# Patient Record
Sex: Male | Born: 1985 | Race: White | Hispanic: Yes | Smoking: Current every day smoker
Health system: Southern US, Community
[De-identification: ages and names within clinical notes are randomized; demographics above are authoritative.]

## PROBLEM LIST (undated history)

## (undated) DIAGNOSIS — Z789 Other specified health status: Secondary | ICD-10-CM

## (undated) HISTORY — PX: WISDOM TOOTH EXTRACTION: SHX21

## (undated) HISTORY — PX: APPENDECTOMY: SHX54

---
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
## 2006-11-19 ENCOUNTER — Emergency Department (HOSPITAL_COMMUNITY): Admission: EM | Admit: 2006-11-19 | Discharge: 2006-11-19 | Payer: Self-pay | Admitting: Emergency Medicine

## 2007-06-02 ENCOUNTER — Emergency Department (HOSPITAL_COMMUNITY): Admission: EM | Admit: 2007-06-02 | Discharge: 2007-06-02 | Payer: Self-pay | Admitting: Emergency Medicine

## 2007-06-09 ENCOUNTER — Emergency Department (HOSPITAL_COMMUNITY): Admission: EM | Admit: 2007-06-09 | Discharge: 2007-06-09 | Payer: Self-pay | Admitting: Emergency Medicine

## 2014-07-23 ENCOUNTER — Encounter (HOSPITAL_COMMUNITY): Payer: Self-pay | Admitting: Emergency Medicine

## 2014-07-23 ENCOUNTER — Emergency Department (INDEPENDENT_AMBULATORY_CARE_PROVIDER_SITE_OTHER)
Admission: EM | Admit: 2014-07-23 | Discharge: 2014-07-23 | Disposition: A | Discharge status: Home / Self Care | Payer: Self-pay | Source: Home / Self Care | Attending: Family Medicine | Admitting: Family Medicine

## 2014-07-23 ENCOUNTER — Emergency Department (INDEPENDENT_AMBULATORY_CARE_PROVIDER_SITE_OTHER): Payer: Self-pay

## 2014-07-23 DIAGNOSIS — M79673 Pain in unspecified foot: Secondary | ICD-10-CM

## 2014-07-23 DIAGNOSIS — S93401A Sprain of unspecified ligament of right ankle, initial encounter: Secondary | ICD-10-CM

## 2014-07-23 MED ORDER — IBUPROFEN 800 MG PO TABS: 800.0000 mg | ORAL_TABLET | Freq: Three times a day (TID) | ORAL | Status: DC

## 2014-07-23 NOTE — ED Provider Notes (Signed)
CSN: [id number]     Arrival date & time 07/23/14  1954 History   First MD Initiated Contact with Patient 07/23/14 2031     Chief Complaint  Patient presents with  . Foot Pain   (Consider location/radiation/quality/duration/timing/severity/associated sxs/prior Treatment) HPI         28 year old male presents for evaluation of a right foot injury. He fell approximately 6 feet off a ladder at work 3 days ago. Since then he has had pain across the midfoot, slight swelling. Pain is increased with weightbearing. No medications taken for treatment. No numbness distal to the injury. No history of foot injuries. He is able to bear weight but with difficulty  History reviewed. No pertinent past medical history. History reviewed. No pertinent past surgical history. No family history on file. History  Substance Use Topics  . Smoking status: Never Smoker   . Smokeless tobacco: Not on file  . Alcohol Use: No    Review of Systems  Musculoskeletal: Positive for arthralgias (right foot pain and swelling).  All other systems reviewed and are negative.   Allergies  Review of patient's allergies indicates no known allergies.  Home Medications   Prior to Admission medications   Medication Sig Start Date End Date Taking? Authorizing Provider  ibuprofen (ADVIL,MOTRIN) 800 MG tablet Take 1 tablet (800 mg total) by mouth 3 (three) times daily. 07/23/14   Adrian BlackwaterZachary H Mica Ramdass, PA-C   BP 157/73  Pulse 60  Temp(Src) 98.6 F (37 C) (Oral)  Resp 14  SpO2 98% Physical Exam  Nursing note and vitals reviewed. Constitutional: He is oriented to person, place, and time. He appears well-developed and well-nourished. No distress.  HENT:  Head: Normocephalic.  Pulmonary/Chest: Effort normal. No respiratory distress.  Musculoskeletal:       Right ankle: Normal.       Right foot: He exhibits tenderness (Tenderness at ATFL). He exhibits normal range of motion, no swelling and no deformity.  Neurological: He is  alert and oriented to person, place, and time. Coordination normal.  Skin: Skin is warm and dry. No rash noted. He is not diaphoretic.  Psychiatric: He has a normal mood and affect. Judgment normal.    ED Course  Procedures (including critical care time) Labs Review Labs Reviewed - No data to display  Imaging Review Dg Foot Complete Right  07/23/2014   CLINICAL DATA:  Foot pain.  Recent fall.  Initial encounter  EXAM: RIGHT FOOT COMPLETE - 3+ VIEW  COMPARISON:  None.  FINDINGS: There is no evidence of fracture or dislocation. There is no evidence of arthropathy or other focal bone abnormality. Soft tissues are unremarkable.  IMPRESSION: Negative.   Electronically Signed   By: Tiburcio PeaJonathan  Watts M.D.   On: 07/23/2014 20:26     MDM   1. Ankle sprain, right, initial encounter   2. Foot pain    Ankle sprain. Treat with Aircast, ibuprofen. Follow-up when necessary   Meds ordered this encounter  Medications  . ibuprofen (ADVIL,MOTRIN) 800 MG tablet    Sig: Take 1 tablet (800 mg total) by mouth 3 (three) times daily.    Dispense:  60 tablet    Refill:  0    Order Specific Question:  Supervising Provider    Answer:  Clementeen GrahamOREY, EVAN, Kathie RhodesS [[id number]]     Graylon GoodZachary H Ouita Nish, PA-C 07/24/14 [phone]

## 2014-07-23 NOTE — Discharge Instructions (Signed)
Esguince de tobillo (Ankle Sprain)  Un esguince de tobillo es una lesin en los tejidos fuertes y fibrosos (ligamentos) que mantienen unidos los huesos de la articulacin del tobillo.  CAUSAS  Las causas pueden ser una cada o la torcedura del tobillo. Los esguinces de tobillo ocurren con ms frecuencia al pisar con el borde exterior del pie, lo que hace que el tobillo se vuelva hacia adentro. Las personas que practican deportes son ms propensas a este tipo de lesiones.  SNTOMAS   Dolor en el tobillo. El dolor puede aparecer durante el reposo o slo al tratar de ponerse de pie o caminar.  Hinchazn.  Hematomas. Los hematomas pueden aparecer inmediatamente o luego de 1 a 2 das despus de la lesin.  Dificultad para pararse o caminar, especialmente al doblar en esquinas o al cambiar de direccin. DIAGNSTICO  El mdico le preguntar detalles acerca de la lesin y le har un examen fsico del tobillo para determinar si tiene un esguince. Durante el examen fsico, el mdico apretar y aplicar presin en reas especficas del pie y del tobillo. El mdico tratar de mover el tobillo en ciertas direcciones. Le indicarn una radiografa para descartar la fractura de un hueso o que un ligamento no se haya separado de uno de los huesos del tobillo (fractura por avulsin).  TRATAMIENTO  Algunos tipos de soporte podrn ayudarlo a estabilizar el tobillo. El profesional que lo asiste le dar las indicaciones. Tambin podr indicarle que use medicamentos para calmar el dolor. Si el esguince es grave, su mdico podr derivarlo a un cirujano que lo ayudar a recuperar la funcin de las partes afectadas del sistema esqueltico (ortopedista) o a un fisioterapeuta.  INSTRUCCIONES PARA EL CUIDADO EN EL HOGAR   Aplique hielo en la articulacin lesionada durante 1  2 das o segn lo que le indique su mdico. La aplicacin del hielo ayuda a reducir la inflamacin y el dolor.  Ponga el hielo en una bolsa  plstica.  Colquese una toalla entre la piel y la bolsa de hielo.  Deje el hielo en el lugar durante 15 a 20 minutos por vez, cada 2 horas mientras est despierto.  Slo tome medicamentos de venta libre o recetados para calmar el dolor, las molestias o bajar la fiebre segn las indicaciones de su mdico.  Eleve el tobillo lesionado por encima del nivel del corazn tanto como pueda durante 2 o 3 das.  Si su mdico le indica el uso de muletas, selas segn las instrucciones. Gradualmente lleve el peso sobre el tobillo afectado. Siga usando muletas o un bastn hasta que pueda caminar sin sentir dolor en el tobillo.  Si tiene una frula de yeso, sela como lo indique su mdico. No se apoye en ninguna cosa ms dura que una almohada durante las primeras 24 horas. No ponga peso sobre la frula. No permita que se moje. Puede quitrsela para tomar una ducha o un bao.  Pueden haberle colocado un vendaje elstico para usar alrededor del tobillo para darle soporte. Si el vendaje elstico est muy ajustado (siente adormecimiento u hormigueo o el pie est fro y azul), ajstelo para que sea ms cmodo.  Si usted tiene una frula de aire, puede soplar o dejar salir el aire para que sea ms cmodo. Puede quitarse la frula por la noche y antes de tomar una ducha o un bao. Mueva los dedos de los pies en la frula varias veces al da para disminuir la hinchazn. SOLICITE ATENCIN MDICA SI:     Le aumenta rpidamente el moretn o el hinchazn.  Los dedos de los pies estn extremadamente fros o pierde la sensibilidad en el pie.  El dolor no se alivia con los medicamentos. SOLICITE ATENCIN MDICA DE INMEDIATO SI:   Los dedos de los pies estn adormecidos o de color azul.  Tiene un dolor agudo que va aumentando. ASEGRESE DE QUE:   Comprende estas instrucciones.  Controlar su enfermedad.  Solicitar ayuda de inmediato si no mejora o empeora. Document Released: 09/11/2005 Document Revised:  06/05/2012 ExitCare Patient Information 2015 [employer]. This information is not intended to replace advice given to you by your health care provider. Make sure you discuss any questions you have with your health care provider.  

## 2014-07-23 NOTE — ED Notes (Signed)
Fell approx 6-7 feet, landed on right foot.  Points to pain being under right ankle, including heel of foot.  Pedal pulses 2 plus.

## 2015-09-24 ENCOUNTER — Emergency Department (INDEPENDENT_AMBULATORY_CARE_PROVIDER_SITE_OTHER)
Admission: EM | Admit: 2015-09-24 | Discharge: 2015-09-24 | Disposition: A | Discharge status: Home / Self Care | Payer: Self-pay | Source: Home / Self Care | Attending: Family Medicine | Admitting: Family Medicine

## 2015-09-24 ENCOUNTER — Encounter (HOSPITAL_COMMUNITY): Payer: Self-pay | Admitting: Emergency Medicine

## 2015-09-24 DIAGNOSIS — M791 Myalgia, unspecified site: Secondary | ICD-10-CM

## 2015-09-24 DIAGNOSIS — J029 Acute pharyngitis, unspecified: Secondary | ICD-10-CM

## 2015-09-24 DIAGNOSIS — R509 Fever, unspecified: Secondary | ICD-10-CM

## 2015-09-24 LAB — POCT RAPID STREP A: Streptococcus, Group A Screen (Direct): NEGATIVE

## 2015-09-24 MED ORDER — KETOROLAC TROMETHAMINE 60 MG/2ML IM SOLN
INTRAMUSCULAR | Status: AC
  Filled 2015-09-24: qty 2

## 2015-09-24 MED ORDER — PENICILLIN G BENZATHINE 1200000 UNIT/2ML IM SUSP
1.2000 10*6.[IU] | Freq: Once | INTRAMUSCULAR | Status: AC
  Administered 2015-09-24: 1.2 10*6.[IU] via INTRAMUSCULAR

## 2015-09-24 MED ORDER — IBUPROFEN 600 MG PO TABS: 600.0000 mg | ORAL_TABLET | Freq: Four times a day (QID) | ORAL | Status: DC | PRN

## 2015-09-24 MED ORDER — KETOROLAC TROMETHAMINE 60 MG/2ML IM SOLN
60.0000 mg | Freq: Once | INTRAMUSCULAR | Status: AC
  Administered 2015-09-24: 60 mg via INTRAMUSCULAR

## 2015-09-24 MED ORDER — AMOXICILLIN 500 MG PO CAPS: 500.0000 mg | ORAL_CAPSULE | Freq: Three times a day (TID) | ORAL | Status: DC

## 2015-09-24 MED ORDER — ACETAMINOPHEN 325 MG PO TABS
ORAL_TABLET | ORAL | Status: AC
  Filled 2015-09-24: qty 2

## 2015-09-24 MED ORDER — PENICILLIN G BENZATHINE 1200000 UNIT/2ML IM SUSP
INTRAMUSCULAR | Status: AC
  Filled 2015-09-24: qty 2

## 2015-09-24 MED ORDER — ACETAMINOPHEN 325 MG PO TABS
650.0000 mg | ORAL_TABLET | Freq: Once | ORAL | Status: AC
  Administered 2015-09-24: 650 mg via ORAL

## 2015-09-24 NOTE — ED Notes (Signed)
Reports sore throat, fever, and generalized body aches.

## 2015-09-24 NOTE — ED Provider Notes (Signed)
CSN: [id number]     Arrival date & time 09/24/15  1303 History   First MD Initiated Contact with Patient 09/24/15 1328     Chief Complaint  Patient presents with  . Sore Throat  . Fever   (Consider location/radiation/quality/duration/timing/severity/associated sxs/prior Treatment) HPI Comments: 29 year old sweating male is accompanied by his wife who speaks [place]. He has had a "very bad" sore throat for 3 days. It is progressing. It is associated with a subjective fever and bad sore throat, body aches, minor headache and "a little bit of vomiting". Denies abdominal pain. When his fever goes up he develops myalgias. He has taken Tylenol to help decrease the fever and that does help him to feel better temporarily. His current temp is 102.7.  Patient is a 29 y.o. male presenting with pharyngitis and fever. The history is provided by the patient and the spouse. A language interpreter was used.  Sore Throat This is a new problem. The current episode started more than 2 days ago. The problem occurs constantly. The problem has been gradually worsening. Associated symptoms include headaches. Pertinent negatives include no chest pain, no abdominal pain and no shortness of breath. The symptoms are aggravated by swallowing and eating. Nothing relieves the symptoms. He has tried acetaminophen for the symptoms. The treatment provided mild relief.  Fever Associated symptoms: headaches, myalgias and sore throat   Associated symptoms: no chest pain, no congestion, no cough, no ear pain and no rash     History reviewed. No pertinent past medical history. History reviewed. No pertinent past surgical history. No family history on file. Social History  Substance Use Topics  . Smoking status: Current Every Day Smoker  . Smokeless tobacco: None  . Alcohol Use: Yes    Review of Systems  Constitutional: Positive for fever, activity change and fatigue.  HENT: Positive for sore throat. Negative for  congestion, ear pain, postnasal drip and voice change.        Odynophagia  Eyes: Negative.   Respiratory: Negative for cough and shortness of breath.   Cardiovascular: Negative for chest pain.  Gastrointestinal: Negative for abdominal pain.  Genitourinary: Negative.   Musculoskeletal: Positive for myalgias.  Skin: Negative for rash.  Neurological: Positive for weakness and headaches. Negative for tremors, syncope and speech difficulty.  Psychiatric/Behavioral: Negative.   All other systems reviewed and are negative.   Allergies  Review of patient's allergies indicates no known allergies.  Home Medications   Prior to Admission medications   Medication Sig Start Date End Date Taking? Authorizing Provider  acetaminophen (TYLENOL) 325 MG tablet Take 650 mg by mouth every 6 (six) hours as needed.   Yes Historical Provider, MD  amoxicillin (AMOXIL) 500 MG capsule Take 1 capsule (500 mg total) by mouth 3 (three) times daily. 09/24/15   Hayden Rasmussen, NP  ibuprofen (ADVIL,MOTRIN) 600 MG tablet Take 1 tablet (600 mg total) by mouth every 6 (six) hours as needed. 09/24/15   Hayden Rasmussen, NP   Meds Ordered and Administered this Visit   Medications  penicillin g benzathine (BICILLIN LA) 1200000 UNIT/2ML injection 1.2 Million Units (not administered)  ketorolac (TORADOL) injection 60 mg (not administered)  acetaminophen (TYLENOL) tablet 650 mg (650 mg Oral Given 09/24/15 1401)    BP 129/84 mmHg  Pulse 98  Temp(Src) 102.7 F (39.3 C) (Oral)  Resp 16  SpO2 98% No data found.   Physical Exam  Constitutional: He is oriented to person, place, and time. He appears well-developed and well-nourished. No distress.  HENT:  Mouth/Throat: Oropharyngeal exudate present.  Bilateral TMs are normal Oropharynx with mildly swollen palatine tonsils. Beefy red, multiple exudates.  Eyes: EOM are normal.  Neck: Normal range of motion. Neck supple.  Cardiovascular: Normal rate, normal heart sounds and  intact distal pulses.   Pulmonary/Chest: Effort normal. No respiratory distress. He has no wheezes. He has no rales. He exhibits no tenderness.  Abdominal: Soft. There is no tenderness.  Musculoskeletal: He exhibits no edema.  Lymphadenopathy:    He has cervical adenopathy.  Neurological: He is alert and oriented to person, place, and time. He exhibits normal muscle tone.  Skin: Skin is warm and dry.  Psychiatric: He has a normal mood and affect.  Nursing note and vitals reviewed.   ED Course  Procedures (including critical care time)  Labs Review Labs Reviewed  POCT RAPID STREP A   Results for orders placed or performed during the hospital encounter of 09/24/15  POCT rapid strep A [facility])  Result Value Ref Range   Streptococcus, Group A Screen (Direct) NEGATIVE NEGATIVE     Imaging Review No results found.   Visual Acuity Review  Right Eye Distance:   Left Eye Distance:   Bilateral Distance:    Right Eye Near:   Left Eye Near:    Bilateral Near:         MDM   1. Exudative pharyngitis   2. Fever in adult   3. Myalgia    Suspect false neg on strep,  Toradol 60 mg IM Bicillin LA 1.2 million u IM Amoxil 500 tid  Cepacol lozenges Ibuprofen 600 mg as directed Increase liquid intake. Need more fluids     Hayden Rasmussenavid Elika Godar, NP 09/24/15 [phone]

## 2015-09-24 NOTE — Discharge Instructions (Signed)
[employer] (Fever, Adult) La fiebre es el aumento de la Arts development officer. A menudo se la define como una temperatura de 100F (38C) o ms. Por lo general, los episodios de fiebre leve o moderada que duran poco tiempo no tienen efectos a largo plazo y no requieren TEFL teacher. Los episodios de fiebre moderada o alta pueden ser [employer] y, a veces, un signo de una enfermedad grave. La sudoracin que se puede presentar cuando los episodios de fiebre son reiterados o prolongados tambin pueden derivar en deshidratacin. Para confirmar la presencia de fiebre, se toma la temperatura con un termmetro. La medicin de la temperatura puede variar en funcin de los siguientes factores:  La edad.  La hora del da.  El lugar donde se coloca el termmetro:  La boca (oral).  El recto (rectal).  El odo (timpnico).  Debajo del brazo Administrator, Civil Service).  La frente (temporal). INSTRUCCIONES PARA EL CUIDADO EN EL HOGAR Est atento a cualquier cambio en los sntomas. Tome estas medidas para controlar la afeccin:  Tome los medicamentos de venta libre y los recetados solamente como se lo haya indicado el mdico. Siga cuidadosamente las indicaciones en cuanto a la dosis.  Si le recetaron un antibitico, tmelo como se lo haya indicado el mdico. No deje de tomar los antibiticos aunque comience a Actor.  Descanse todo lo que sea necesario.  Beba suficiente lquido para Photographer orina clara o de color amarillo plido. Esto ayuda a Statistician.  Higiencese con Delma Freeze o dese un bao con agua a temperatura ambiente para ayudar a Teaching laboratory technician, si es necesario. No use agua helada.  No se tape con demasiadas mantas ni use ropa abrigada. SOLICITE ATENCIN MDICA SI:  Vomita.  No puede comer ni beber sin vomitar.  Tiene diarrea.  Siente dolor al [employer].  Los sntomas no mejoran con Scientist, research (medical).  Presenta nuevos sntomas.  Est muy  dbil. SOLICITE ATENCIN MDICA DE INMEDIATO SI:  Le falta el aire o tiene dificultad para respirar.  Est mareado o se desmaya.  Est desorientado o confundido.  Tiene signos de deshidratacin, como sequedad en la boca, disminucin de la cantidad de [place].  Siente dolor intenso en el abdomen.  Tiene nuseas o diarrea persistentes.  Tiene una erupcin cutnea.  Los sntomas empeoran repentinamente.   Esta informacin no tiene Theme park manager el consejo del mdico. Asegrese de hacerle al mdico cualquier pregunta que tenga.   Document Released: 06/21/2005 Document Revised: 06/02/2015 Elsevier Interactive Patient Education 2016 [employer].  Faringitis (Pharyngitis) La faringitis ocurre cuando la faringe presenta enrojecimiento, dolor e hinchazn (inflamacin).  CAUSAS  Normalmente, la faringitis se debe a una infeccin. Generalmente, estas infecciones ocurren debido a virus (viral) y se presentan cuando las personas se resfran. Sin embargo, a Advertising account executive faringitis es provocada por bacterias (bacteriana). Las alergias tambin pueden ser una causa de la faringitis. La faringitis viral se puede contagiar de Neomia Dear persona a otra al toser, estornudar y compartir objetos o utensilios personales (tazas, tenedores, cucharas, cepillos de diente). La faringitis bacteriana se puede contagiar de Neomia Dear persona a otra a travs de un contacto ms ntimo, como besar.  SIGNOS Y SNTOMAS  Los sntomas de la faringitis incluyen los siguientes:   Dolor de Advertising copywriter.  Cansancio (fatiga).  Fiebre no muy elevada.  Dolor de [place].  Dolores musculares y en las articulaciones.  Erupciones cutneas  Ganglios linfticos hinchados.  Una pelcula parecida a las placas en la  garganta o las amgdalas (frecuente con la faringitis bacteriana). DIAGNSTICO  El mdico le har preguntas sobre la enfermedad y sus sntomas. Normalmente, todo lo que se necesita para diagnosticar una faringitis son  sus antecedentes mdicos y un examen fsico. A veces se realiza una prueba rpida para estreptococos. Tambin es posible que se realicen otros anlisis de laboratorio, segn la posible causa.  TRATAMIENTO  La faringitis viral normalmente mejorar en un plazo de 3 a 4das sin medicamentos. La faringitis bacteriana se trata con medicamentos que McGraw-Hillmatan los grmenes (antibiticos).  INSTRUCCIONES PARA EL CUIDADO EN EL HOGAR   Beba gran cantidad de lquido para mantener la orina de tono claro o color amarillo plido.  Tome solo medicamentos de venta libre o recetados, segn las indicaciones del mdico.  Si le receta antibiticos, asegrese de terminarlos, incluso si comienza a Actorsentirse mejor.  No tome aspirina.  Descanse lo suficiente.  Hgase grgaras con 8onzas (227ml) de agua con sal (cucharadita de sal por litro de agua) cada 1 o 2horas para Science writercalmar la garganta.  Puede usar pastillas (si no corre riesgo de Health visitorahogarse) o aerosoles para Science writercalmar la garganta. SOLICITE ATENCIN MDICA SI:   Tiene bultos grandes y dolorosos en el cuello.  Tiene una erupcin cutnea.  Cuando tose elimina una expectoracin verde, amarillo amarronado o con Wilkinsonsangre. SOLICITE ATENCIN MDICA DE INMEDIATO SI:   El cuello se pone rgido.  Comienza a babear o no puede tragar lquidos.  Vomita o no puede retener los American International Groupmedicamentos ni los lquidos.  Siente un dolor intenso que no se alivia con los medicamentos recomendados.  Tiene dificultades para respirar (y no debido a la nariz tapada). ASEGRESE DE QUE:   Comprende estas instrucciones.  Controlar su afeccin.  Recibir ayuda de inmediato si no mejora o si empeora.   Esta informacin no tiene Theme park managercomo fin reemplazar el consejo del mdico. Asegrese de hacerle al mdico cualquier pregunta que tenga.   Document Released: 06/21/2005 Document Revised: 07/02/2013 Elsevier Interactive Patient Education 2016 [employer]  Dolor de garganta  (Sore  Throat) Cepacol lozenges Ibuprofen 600 mg as directed Increase liquid intake. Need more fluids  El dolor de garganta es Chief Technology Officerel dolor, ardor, irritacin o sensacin de Clinical research associatepicazn en la garganta. Generalmente hay dolor o molestias al tragar o hablar. Un dolor de garganta puede estar acompaado de otros sntomas, como tos, estornudos, fiebre y ganglios hinchados en el cuello. Generalmente es Financial risk analystel primer signo de otra enfermedad, como un resfrio, gripe, anginas o mononucleosis (conocida como mono). La mayor parte de los dolores de garganta desaparecen sin tratamiento mdico. CAUSAS  Las causas ms comunes de dolor de garganta son:   Infecciones virales, como un resfrio, gripe o mononucleosis.  Infeccin bacteriana, como faringitis estreptoccica, amigdalitis, o tos ferina.  Alergias estacionales.  La sequedad en el aire.  Algunos irritantes, como el humo o la polucin.  Reflujo gastroesofgico. INSTRUCCIONES PARA EL CUIDADO EN EL HOGAR   Tome slo la medicacin que le indic el mdico.  Debe ingerir gran cantidad de lquido para mantener la orina de tono claro o color amarillo plido.  Descanse todo lo que sea necesario.  Trate de usar Unisys Corporationaerosoles para la garganta, pastillas o chupe caramelos duros para Engineer, materialsaliviar el dolor (si es mayor de 4 aos o segn lo que le indiquen).  Beba lquidos calientes, como caldos, infusiones de hierbas o agua caliente con miel para calmar el dolor momentneamente. Tambin puede comer o beber lquidos fros o congelados tales como paletas de hielo congelado.  Haga grgaras con agua con sal (mezclar 1 cucharadita de sal en 8 onzas [250 cm3] de agua).  No fume, y evite el humo de otros fumadores.  Ponga un humidificador de vapor fro en la habitacin por la noche para humedecer el aire. Tambin se puede activar en una ducha de agua caliente y sentarse en el bao con la puerta cerrada durante 5-10 minutos. SOLICITE ATENCIN MDICA DE INMEDIATO SI:   Tiene dificultad  para respirar.  No puede tragar lquidos, alimentos blandos, o su saliva.  Usted tiene ms inflamacin en la garganta.  El dolor de garganta no mejora en [address].  Tiene nuseas o vmitos.  Tiene fiebre o sntomas que persisten durante ms de 2 o 3 das.  Tiene fiebre y los sntomas empeoran de manera sbita. ASEGRESE DE QUE:   Comprende estas instrucciones.  Controlar su enfermedad.  Solicitar ayuda de inmediato si no mejora o si empeora.   Esta informacin no tiene Theme park manager el consejo del mdico. Asegrese de hacerle al mdico cualquier pregunta que tenga.   Document Released: 09/11/2005 Document Revised: 08/28/2012 Elsevier Interactive Patient Education [employer]

## 2015-09-27 LAB — CULTURE, GROUP A STREP

## 2016-07-09 ENCOUNTER — Emergency Department (HOSPITAL_COMMUNITY): Payer: Self-pay

## 2016-07-09 ENCOUNTER — Emergency Department (HOSPITAL_COMMUNITY)
Admission: EM | Admit: 2016-07-09 | Discharge: 2016-07-10 | Disposition: A | Discharge status: Home / Self Care | Payer: Self-pay | Attending: Emergency Medicine | Admitting: Emergency Medicine

## 2016-07-09 DIAGNOSIS — F17200 Nicotine dependence, unspecified, uncomplicated: Secondary | ICD-10-CM | POA: Insufficient documentation

## 2016-07-09 DIAGNOSIS — M79644 Pain in right finger(s): Secondary | ICD-10-CM

## 2016-07-09 NOTE — ED Triage Notes (Signed)
Patient arrives via [facility] with complaint of proximal right thumb pain. Pain is chronic for patient. [facility] reports that injury did not occur from their acquisition of patient. Right thumb appears mildly deformed at base.

## 2016-07-09 NOTE — ED Provider Notes (Signed)
[facility] Provider Note  By signing my name below, I, Earmon PhoenixJennifer Waddell, attest that this documentation has been prepared under the direction and in the presence of Will Hanifah Royse, PA-C. Electronically Signed: Earmon PhoenixJennifer Waddell, ED Scribe. 07/09/16. 11:37 PM.   History   Chief Complaint Chief Complaint  Patient presents with  . Hand Pain   The history is provided by the patient and medical records. A language interpreter was used.    HPI Comments:  Edward Gardner is a 30 y.o. male brought in by [employer] who presents to the Emergency Department complaining of right thumb pain that began yesterday while he was working. He reports this happened before he was in [employer] custody. He is not forthcoming on further details of how this injury occurred. Pt denies any other injuries. He denies numbness, tingling or weakness.    No past medical history on file.  There are no active problems to display for this patient.   No past surgical history on file.     Home Medications    Prior to Admission medications   Medication Sig Start Date End Date Taking? Authorizing Provider  acetaminophen (TYLENOL) 325 MG tablet Take 2 tablets (650 mg total) by mouth every 6 (six) hours as needed for mild pain or moderate pain. 07/10/16   Everlene FarrierWilliam Baily Serpe, PA-C  amoxicillin (AMOXIL) 500 MG capsule Take 1 capsule (500 mg total) by mouth 3 (three) times daily. 09/24/15   Hayden Rasmussenavid Mabe, NP  ibuprofen (ADVIL,MOTRIN) 600 MG tablet Take 1 tablet (600 mg total) by mouth every 6 (six) hours as needed. 09/24/15   Hayden Rasmussenavid Mabe, NP    Family History No family history on file.  Social History Social History  Substance Use Topics  . Smoking status: Current Every Day Smoker  . Smokeless tobacco: Not on file  . Alcohol use Yes     Allergies   Review of patient's allergies indicates no known allergies.   Review of Systems Review of Systems  Musculoskeletal: Positive for arthralgias. Negative for joint swelling.    Skin: Negative for color change and wound.  Neurological: Negative for weakness and numbness.     Physical Exam Updated Vital Signs BP 152/91   Pulse 98   Temp 98 F (36.7 C) (Oral)   Resp 16   SpO2 100%   Physical Exam  Constitutional: He appears well-developed and well-nourished. No distress.  HENT:  Head: Normocephalic and atraumatic.  Eyes: Right eye exhibits no discharge. Left eye exhibits no discharge.  Cardiovascular: Normal rate, regular rhythm and intact distal pulses.   Good capillary refill of right hand. Good bilateral radial pulses  Pulmonary/Chest: Effort normal. No respiratory distress.  Musculoskeletal: Normal range of motion. He exhibits tenderness. He exhibits no edema or deformity.  TTP to the base of his right thumb. No edema or ecchymosis of right thumb or right hand. No deformity.  Neurological: He is alert. Coordination normal.  Distal sensations of fingers of right hand intact.  Skin: Skin is warm and dry. No rash noted. He is not diaphoretic. No erythema. No pallor.  Psychiatric: He has a normal mood and affect.  Patient is irritable.   Nursing note and vitals reviewed.    ED Treatments / Results  DIAGNOSTIC STUDIES: Oxygen Saturation is 100% on RA, normal by my interpretation.   COORDINATION OF CARE: 11:36 PM- Will X-Ray right thumb. Pt verbalizes understanding and agrees to plan.  Medications - No data to display  Labs (all labs ordered are listed, but only  abnormal results are displayed) Labs Reviewed - No data to display  EKG  EKG Interpretation None       Radiology Dg Hand Complete Right  Result Date: 07/10/2016 CLINICAL DATA:  30 y/o  M; injury with first metacarpal pain. EXAM: RIGHT HAND - COMPLETE 3+ VIEW COMPARISON:  None. FINDINGS: There is no evidence of fracture or dislocation. There is no evidence of arthropathy or other focal bone abnormality. IMPRESSION: No acute fracture or dislocation identified. Electronically  Signed   By: Mitzi Hansen M.D.   On: 07/10/2016 01:08    Procedures Procedures (including critical care time)  Medications Ordered in ED Medications - No data to display   Initial Impression / Assessment and Plan / ED Course  I have reviewed the triage vital signs and the nursing notes.  Pertinent labs & imaging results that were available during my care of the patient were reviewed by me and considered in my medical decision making (see chart for details).  Clinical Course   This is a 30 y.o. male brought in by [employer] who presents to the Emergency Department complaining of right thumb pain that began yesterday while he was working. He reports this happened before he was in [employer] custody. He is not forthcoming on further details of how this injury occurred. On exam the patient is afebrile nontoxic appearing. His tenderness to the base of his thumb on his right hand. No deformity noted. No ecchymosis, edema or warmth. He is neurovascular intact. X-rays unremarkable. Will place the patient a thumb spica splint and have him follow-up with hand surgery as patient is not very forthcoming on history and exam. I encouraged him to follow-up with hand surgery when he is released from jail. I encouraged him to use Tylenol and ice for treatment of his pain. I discussed return precautions. The patient verbalized understanding and agreement with plan. He is released to [employer].   Final Clinical Impressions(s) / ED Diagnoses   Final diagnoses:  Pain of right thumb    New Prescriptions Current Discharge Medication List     I personally performed the services described in this documentation, which was scribed in my presence. The recorded information has been reviewed and is accurate.        Everlene Farrier, PA-C 07/10/16 0132    Gilda Crease, MD 07/11/16 [phone]

## 2016-07-10 MED ORDER — ACETAMINOPHEN 325 MG PO TABS: 650.0000 mg | ORAL_TABLET | Freq: Four times a day (QID) | ORAL | 0 refills | Status: DC | PRN

## 2017-03-05 ENCOUNTER — Other Ambulatory Visit: Payer: Self-pay | Admitting: Occupational Medicine

## 2017-03-05 ENCOUNTER — Ambulatory Visit: Payer: Self-pay

## 2017-03-05 DIAGNOSIS — M79632 Pain in left forearm: Secondary | ICD-10-CM

## 2018-01-28 IMAGING — CR DG FOREARM 2V*L*
2 series · 2 of 2 positions shown · non-contrast
Comparison: None.

CLINICAL DATA: Recent puncture from nail

EXAM:
LEFT FOREARM - 2 VIEW

[view not recorded (1 of 2)]
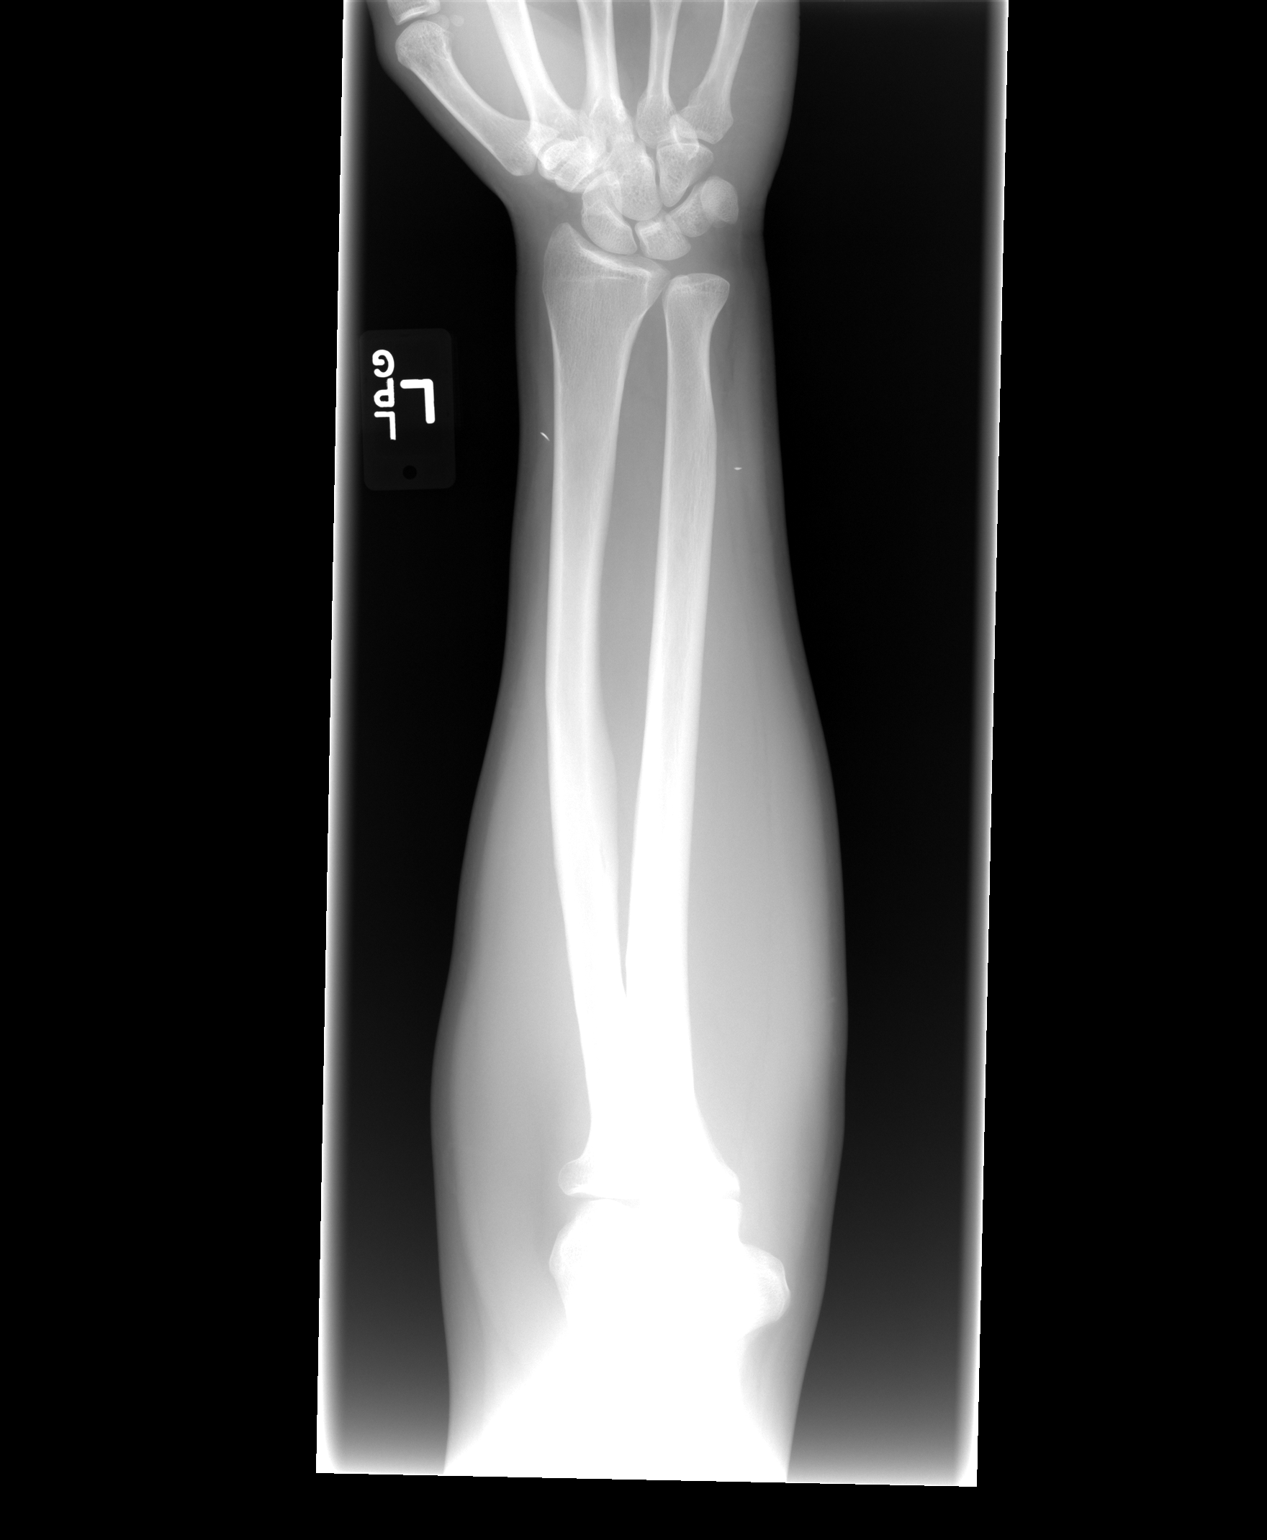

[view not recorded (2 of 2)]
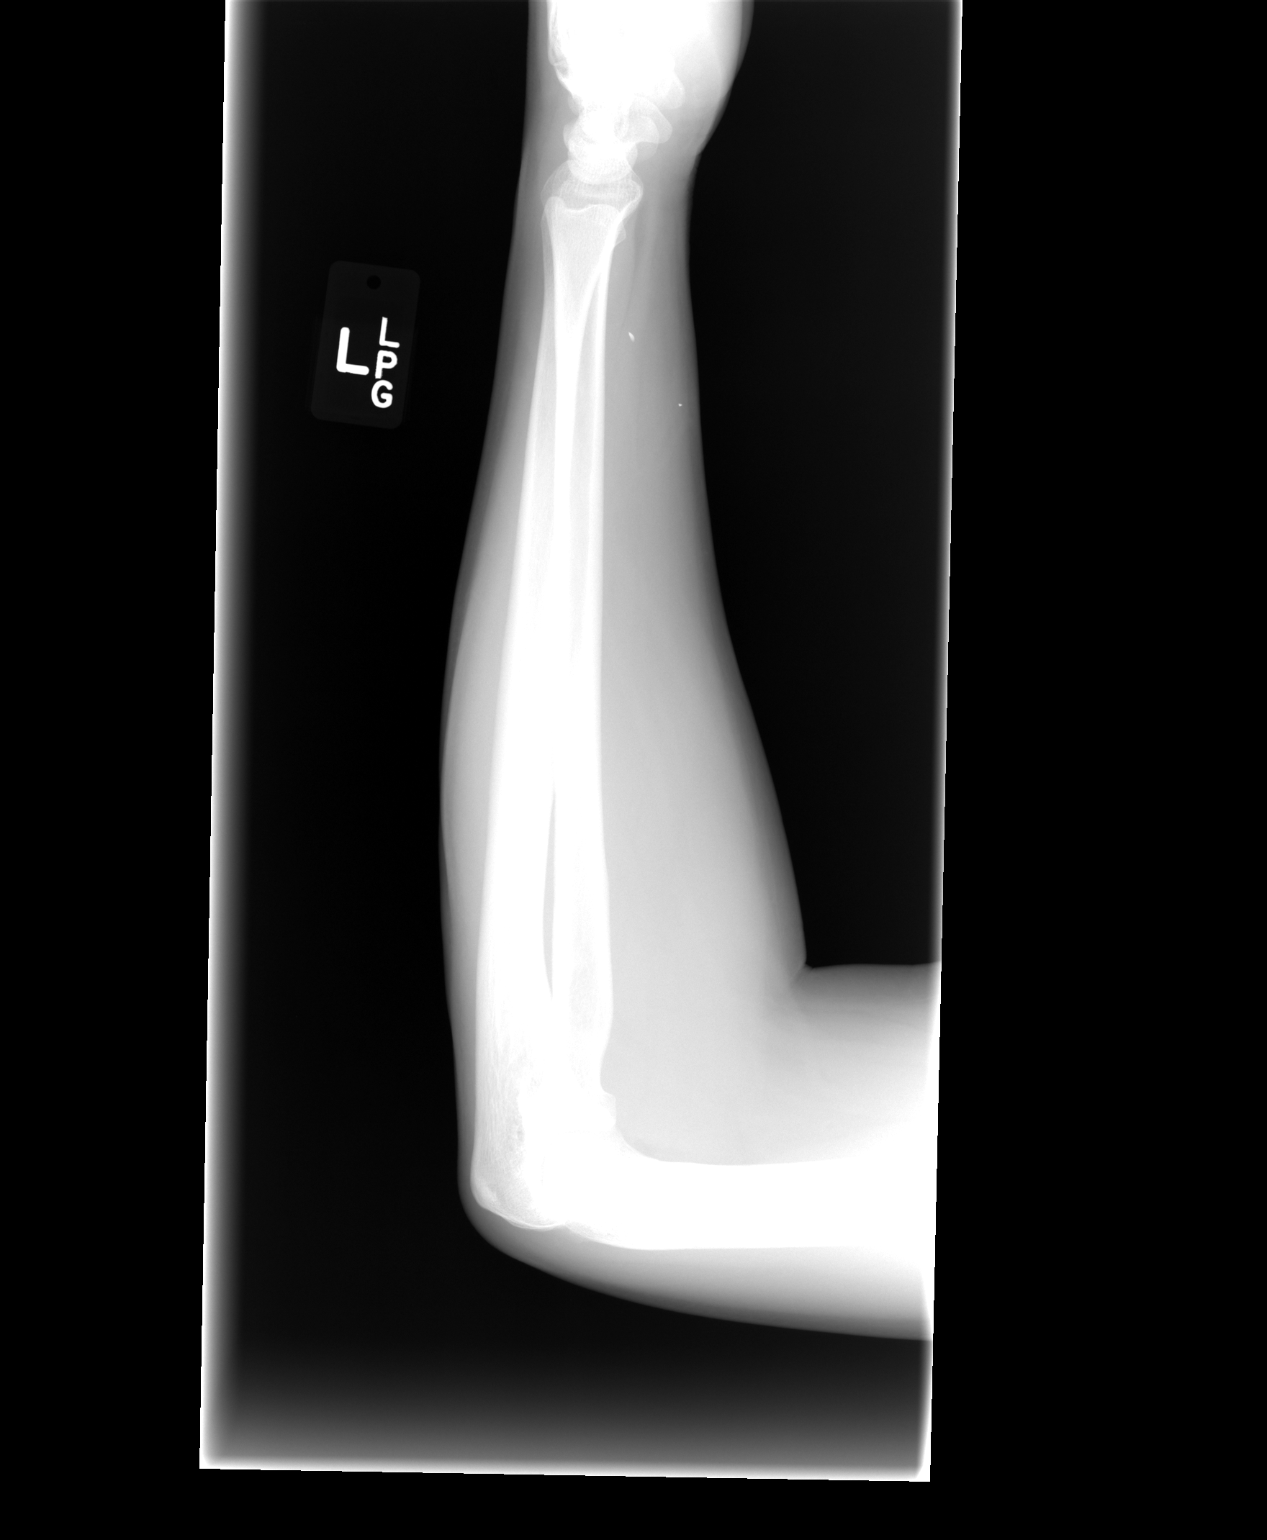

[2 of 2 positions shown; findings below may reference images not displayed]

FINDINGS: Frontal and lateral views were obtained. There is a linear metallic
foreign body measuring 3 mm in length located lateral and volar to
the distal radial diaphysis. There is a radiopaque foreign body
measuring just over 1 mm in length volar and medial to the distal
ulnar diaphysis. No other radiopaque foreign bodies identified. No
fracture or dislocation. Joint spaces appear normal. No abnormal
periosteal reaction. No bony destruction.
IMPRESSION: There are 2 small radiopaque foreign bodies as noted above. No bony
abnormality. Joint spaces appear normal.

## 2018-05-21 ENCOUNTER — Ambulatory Visit (INDEPENDENT_AMBULATORY_CARE_PROVIDER_SITE_OTHER): Payer: Self-pay | Admitting: Physician Assistant

## 2018-05-21 ENCOUNTER — Encounter (INDEPENDENT_AMBULATORY_CARE_PROVIDER_SITE_OTHER): Payer: Self-pay | Admitting: Physician Assistant

## 2018-05-21 VITALS — BP 118/71 | HR 65 | Temp 98.1°F | Resp 14 | Ht 67.75 in | Wt 192.6 lb

## 2018-05-21 DIAGNOSIS — Z131 Encounter for screening for diabetes mellitus: Secondary | ICD-10-CM

## 2018-05-21 DIAGNOSIS — Z23 Encounter for immunization: Secondary | ICD-10-CM

## 2018-05-21 DIAGNOSIS — R1013 Epigastric pain: Secondary | ICD-10-CM

## 2018-05-21 DIAGNOSIS — K219 Gastro-esophageal reflux disease without esophagitis: Secondary | ICD-10-CM

## 2018-05-21 MED ORDER — RANITIDINE HCL 150 MG PO TABS: 150.0000 mg | ORAL_TABLET | Freq: Two times a day (BID) | ORAL | 0 refills | Status: DC

## 2018-05-21 NOTE — Progress Notes (Signed)
Subjective:  Patient ID: Edward Gardner, male    DOB: 04/09/1986  Age: 32 y.o. MRN: [id number]  CC: abdominal pain  HPI Edward Gardner is a 32 y.o. male with a medical history of appendectomy presents as a new patient with mild epigastric abdominal pain since one month ago. However he has been well for a week. Says he also has occasional acid reflux with acidic foods and spicy foods. Pain was 3-4/10. Has not taken anything for relief. Does not endorse melena, BRBPR, or hematemesis. Says he has a family hx of pancreatic cancer and has become concerned if his symptoms are related.     Has also been told he should have his sugar checked after visiting his ophthalmologist five days ago. Pt has also been told his BP was elevated. Has family hx of diabetes and hypertension. Does not endorse any other symptoms or complaints.     Outpatient Medications Prior to Visit  Medication Sig Dispense Refill  . acetaminophen (TYLENOL) 325 MG tablet Take 2 tablets (650 mg total) by mouth every 6 (six) hours as needed for mild pain or moderate pain. 60 tablet 0  . amoxicillin (AMOXIL) 500 MG capsule Take 1 capsule (500 mg total) by mouth 3 (three) times daily. 21 capsule 0  . ibuprofen (ADVIL,MOTRIN) 600 MG tablet Take 1 tablet (600 mg total) by mouth every 6 (six) hours as needed. 20 tablet 0   No facility-administered medications prior to visit.      ROS Review of Systems  Constitutional: Negative for chills, fever and malaise/fatigue.  Eyes: Negative for blurred vision.  Respiratory: Negative for shortness of breath.   Cardiovascular: Negative for chest pain and palpitations.  Gastrointestinal: Positive for abdominal pain (epigastric but not currently). Negative for nausea.  Genitourinary: Negative for dysuria and hematuria.  Musculoskeletal: Negative for joint pain and myalgias.  Skin: Negative for rash.  Neurological: Negative for tingling and headaches.  Psychiatric/Behavioral: Negative for  depression. The patient is not nervous/anxious.     Objective:  There were no vitals taken for this visit.  BP/Weight 07/09/2016 09/24/2015 07/23/2014  Systolic BP 152 129 157  Diastolic BP 91 84 73      Physical Exam  Constitutional: He is oriented to person, place, and time.  Well developed, well nourished, NAD, polite  HENT:  Head: Normocephalic and atraumatic.  Eyes: No scleral icterus.  Neck: Normal range of motion. Neck supple. No thyromegaly present.  Cardiovascular: Normal rate, regular rhythm and normal heart sounds.  No LE edema bilaterally  Pulmonary/Chest: Effort normal and breath sounds normal.  Abdominal: Soft. Bowel sounds are normal. There is no tenderness.  No hepatomegaly.  Musculoskeletal: He exhibits no edema.  Neurological: He is alert and oriented to person, place, and time.  Skin: Skin is warm and dry. No rash noted. No erythema. No pallor.  Psychiatric: He has a normal mood and affect. His behavior is normal. Thought content normal.  Vitals reviewed.    Assessment & Plan:   1. Epigastric discomfort - CBC with Differential - Comprehensive metabolic panel - Lipase  2. Gastroesophageal reflux disease, esophagitis presence not specified - Begin ranitidine (ZANTAC) 150 MG tablet; Take 1 tablet (150 mg total) by mouth 2 (two) times daily.  Dispense: 60 tablet; Refill: 0  3. Screening for diabetes mellitus - Hemoglobin A1c, order sent to [facility]. Our A1c machine is showing error with the cartridge. Will call manufacturer for help resolving this issue.  4. Need for prophylactic vaccination and inoculation against  influenza - Flu Vaccine QUAD 6+ mos PF IM (Fluarix Quad PF)   Meds ordered this encounter  Medications  . ranitidine (ZANTAC) 150 MG tablet    Sig: Take 1 tablet (150 mg total) by mouth 2 (two) times daily.    Dispense:  60 tablet    Refill:  0    Order Specific Question:   Supervising Provider    Answer:   Hoy Register [[id number]]     Follow-up: Return if symptoms worsen or fail to improve.   Loletta Specter PA

## 2018-05-21 NOTE — Patient Instructions (Signed)
Opciones de alimentos para pacientes con reflujo gastroesofágico - Adultos  (Food Choices for Gastroesophageal Reflux Disease, Adult)  Cuando se tiene reflujo gastroesofágico (ERGE), los alimentos que se ingieren y los hábitos de alimentación son muy importantes. Elegir los alimentos adecuados puede ayudar a aliviar las molestias.  ¿QUÉ PAUTAS DEBO SEGUIR?  · Elija las frutas, los vegetales, los cereales integrales y los productos lácteos con bajo contenido de grasa.  · Elija las carnes de vaca, de pescado y de ave con bajo contenido de grasas.  · Limite las grasas, como los aceites, los aderezos para ensalada, la manteca, los frutos secos y el aguacate.  · Lleve un registro de alimentos. Esto ayuda a identificar los alimentos que ocasionan síntomas.  · Evite los alimentos que le ocasionen síntomas. Pueden ser distintos para cada persona.  · Haga comidas pequeñas durante el día en lugar de 3 comidas abundantes.  · Coma lentamente, en un lugar donde esté distendido.  · Limite el consumo de alimentos fritos.  · Cocine los alimentos utilizando métodos que no sean la fritura.  · Evite el consumo alcohol.  · Evite beber grandes cantidades de líquidos con las comidas.  · Evite agacharse o recostarse hasta después de 2 o 3 horas de haber comido.    ¿QUÉ ALIMENTOS NO SE RECOMIENDAN?  Estos son algunos alimentos y bebidas que pueden empeorar los síntomas:  Vegetales  Tomates. Jugo de tomate. Salsa de tomate y espagueti. Ajíes. Cebolla y ajo. Rábano picante.  Frutas  Naranjas, pomelos y limón (fruta y jugo).  Carnes  Carnes de vaca, de pescado y de ave con gran contenido de grasas. Esto incluye los perros calientes, las costillas, el jamón, la salchicha, el salame y el tocino.  Lácteos  Leche entera y leche chocolatada. Crema ácida. Crema. Mantequilla. Helados. Queso crema.  Bebidas  Té o café. Bebidas gaseosas o bebidas energizantes.  Condimentos  Salsa picante. Salsa barbacoa.  Dulces/postres   Chocolate y cacao. Rosquillas. Menta y mentol.  Grasas y aceites  Alimentos muy grasos. Esto incluye las papas fritas.  Otros  Vinagre. Especias picantes. Esto incluye la pimienta negra, la pimienta blanca, la pimienta roja, la pimienta de cayena, el curry en polvo, los clavos de olor, el jengibre y el chile en polvo.  Esta no es una lista completa de los alimentos y las bebidas que se deben evitar. Comuníquese con el nutricionista para recibir más información.  Esta información no tiene como fin reemplazar el consejo del médico. Asegúrese de hacerle al médico cualquier pregunta que tenga.  Document Released: 03/12/2012 Document Revised: 10/02/2014 Document Reviewed: 07/16/2013  Elsevier Interactive Patient Education © 2017 Elsevier Inc.

## 2018-05-22 ENCOUNTER — Telehealth (INDEPENDENT_AMBULATORY_CARE_PROVIDER_SITE_OTHER): Payer: Self-pay

## 2018-05-22 LAB — HEMOGLOBIN A1C
Est. average glucose Bld gHb Est-mCnc: 111 mg/dL
Hgb A1c MFr Bld: 5.5 % (ref 4.8–5.6)

## 2018-05-22 LAB — CBC WITH DIFFERENTIAL/PLATELET
BASOS ABS: 0 10*3/uL (ref 0.0–0.2)
BASOS: 0 %
EOS (ABSOLUTE): 0.1 10*3/uL (ref 0.0–0.4)
EOS: 1 %
HEMATOCRIT: 44.8 % (ref 37.5–51.0)
HEMOGLOBIN: 15.4 g/dL (ref 13.0–17.7)
Immature Grans (Abs): 0 10*3/uL (ref 0.0–0.1)
Immature Granulocytes: 0 %
LYMPHS ABS: 1.9 10*3/uL (ref 0.7–3.1)
Lymphs: 21 %
MCH: 29.1 pg (ref 26.6–33.0)
MCHC: 34.4 g/dL (ref 31.5–35.7)
MCV: 85 fL (ref 79–97)
Monocytes Absolute: 0.5 10*3/uL (ref 0.1–0.9)
Monocytes: 6 %
NEUTROS ABS: 6.7 10*3/uL (ref 1.4–7.0)
Neutrophils: 72 %
Platelets: 243 10*3/uL (ref 150–450)
RBC: 5.29 x10E6/uL (ref 4.14–5.80)
RDW: 14.2 % (ref 12.3–15.4)
WBC: 9.3 10*3/uL (ref 3.4–10.8)

## 2018-05-22 LAB — COMPREHENSIVE METABOLIC PANEL
A/G RATIO: 1.9 (ref 1.2–2.2)
ALBUMIN: 5 g/dL (ref 3.5–5.5)
ALK PHOS: 89 IU/L (ref 39–117)
ALT: 34 IU/L (ref 0–44)
AST: 25 IU/L (ref 0–40)
BILIRUBIN TOTAL: 0.3 mg/dL (ref 0.0–1.2)
BUN / CREAT RATIO: 19 (ref 9–20)
BUN: 16 mg/dL (ref 6–20)
CO2: 23 mmol/L (ref 20–29)
Calcium: 9.5 mg/dL (ref 8.7–10.2)
Chloride: 101 mmol/L (ref 96–106)
Creatinine, Ser: 0.86 mg/dL (ref 0.76–1.27)
GFR calc non Af Amer: 115 mL/min/{1.73_m2} (ref >59)
GFR, EST AFRICAN AMERICAN: 133 mL/min/{1.73_m2} (ref >59)
GLOBULIN, TOTAL: 2.6 g/dL (ref 1.5–4.5)
Glucose: 89 mg/dL (ref 65–99)
Potassium: 4.2 mmol/L (ref 3.5–5.2)
SODIUM: 141 mmol/L (ref 134–144)
TOTAL PROTEIN: 7.6 g/dL (ref 6.0–8.5)

## 2018-05-22 LAB — LIPASE: LIPASE: 27 U/L (ref 13–78)

## 2018-05-22 NOTE — Telephone Encounter (Signed)
-----   Message from Loletta Specteroger David Gomez, PA-C sent at 05/22/2018 12:58 PM EDT ----- All labs normal.

## 2018-05-22 NOTE — Telephone Encounter (Signed)
Call placed using pacific interpreter [phone]) patient is aware of all normal labs. Patient did not have any questions and expressed understanding. Maryjean Mornempestt S Keiffer Piper, CMA

## 2019-01-06 ENCOUNTER — Ambulatory Visit (HOSPITAL_COMMUNITY)
Admission: EM | Admit: 2019-01-06 | Discharge: 2019-01-06 | Disposition: A | Discharge status: Home / Self Care | Payer: Self-pay | Attending: Family Medicine | Admitting: Family Medicine

## 2019-01-06 ENCOUNTER — Encounter (HOSPITAL_COMMUNITY): Payer: Self-pay | Admitting: Emergency Medicine

## 2019-01-06 ENCOUNTER — Other Ambulatory Visit: Payer: Self-pay

## 2019-01-06 DIAGNOSIS — J069 Acute upper respiratory infection, unspecified: Secondary | ICD-10-CM

## 2019-01-06 MED ORDER — ACETAMINOPHEN 500 MG PO TABS: 500.0000 mg | ORAL_TABLET | Freq: Four times a day (QID) | ORAL | 0 refills | Status: DC | PRN

## 2019-01-06 MED ORDER — ACETAMINOPHEN 325 MG PO TABS
650.0000 mg | ORAL_TABLET | Freq: Once | ORAL | Status: AC
  Administered 2019-01-06: 650 mg via ORAL

## 2019-01-06 MED ORDER — ACETAMINOPHEN 325 MG PO TABS
ORAL_TABLET | ORAL | Status: AC
Start: 2019-01-06 — End: ?
  Filled 2019-01-06: qty 2

## 2019-01-06 MED ORDER — GUAIFENESIN ER 600 MG PO TB12: 1200.0000 mg | ORAL_TABLET | Freq: Two times a day (BID) | ORAL | 0 refills | Status: AC | PRN

## 2019-01-06 NOTE — ED Provider Notes (Signed)
[facility]    CSN: [id number] Arrival date & time: 01/06/19  [id number]     History   Chief Complaint Chief Complaint  Patient presents with  . URI    HPI Edward Gardner is a 33 y.o. male.   [name] presents with complaints of chest pain with inspiration , pain with cough, fever, nasal drainage, small sore throat, no ear pain. Complaints of having a "cold." No gi/gu complaints. Brother had cold symptoms but has gotten better. Took OTC cold medication which helped, took yesterday last dose. Feels worse at night. No palpitations. No recent travel. No leg pain or swelling. No recent hospitalizations or surgeries. Felt similar when had tonsillitis. Worked last week, works Furniture conservator/restorermaking palettes, in [employer]. Doesn't smoke. No asthma history.    Spanish video interpreter used to collect history and physical exam.       History reviewed. No pertinent past medical history.  There are no active problems to display for this patient.   History reviewed. No pertinent surgical history.     Home Medications    Prior to Admission medications   Medication Sig Start Date End Date Taking? Authorizing Provider  NON FORMULARY XL-3   Yes [provider]  acetaminophen (TYLENOL) 500 MG tablet Take 1 tablet (500 mg total) by mouth every 6 (six) hours as needed. 01/06/19   Georgetta HaberBurky, Natalie B, NP  guaiFENesin (MUCINEX) 600 MG 12 hr tablet Take 2 tablets (1,200 mg total) by mouth 2 (two) times daily as needed for up to 5 days. 01/06/19 01/11/19  Georgetta HaberBurky, Natalie B, NP  ibuprofen (ADVIL,MOTRIN) 600 MG tablet Take 1 tablet (600 mg total) by mouth every 6 (six) hours as needed. 09/24/15   Hayden RasmussenMabe, David, NP    Family History Family History  Problem Relation Age of Onset  . Hypertension Mother   . Hypertension Brother     Social History Social History   Tobacco Use  . Smoking status: Former Smoker  Substance Use Topics  . Alcohol use: Yes  . Drug use: No     Allergies    Patient has no known allergies.   Review of Systems Review of Systems   Physical Exam Triage Vital Signs ED Triage Vitals  Enc Vitals Group     BP 01/06/19 0907 137/74     Pulse Rate 01/06/19 0907 (!) 101     Resp 01/06/19 0907 18     Temp 01/06/19 0907 (!) 101 F (38.3 C)     Temp Source 01/06/19 0907 Oral     SpO2 01/06/19 0907 97 %     Weight --      Height --      Head Circumference --      Peak Flow --      Pain Score 01/06/19 0911 8     Pain Loc --      Pain Edu? --      Excl. in GC? --    No data found.  Updated Vital Signs BP 137/74 (BP Location: Left Arm)   Pulse (!) 101   Temp (!) 101 F (38.3 C) (Oral)   Resp 18   SpO2 97%   Visual Acuity Right Eye Distance:   Left Eye Distance:   Bilateral Distance:    Right Eye Near:   Left Eye Near:    Bilateral Near:     Physical Exam Vitals signs and nursing note reviewed.  Constitutional:      Appearance: Normal appearance.  HENT:  Head: Normocephalic and atraumatic.     Mouth/Throat:     Mouth: Mucous membranes are moist.     Pharynx: No oropharyngeal exudate.  Cardiovascular:     Rate and Rhythm: Tachycardia present.  Pulmonary:     Effort: Pulmonary effort is normal. No respiratory distress.  Neurological:     General: No focal deficit present.     Mental Status: He is alert and oriented to person, place, and time.    High risk for covid-19 with limited physical exam performed. Throat without acute findings. No increased work of breathing, no tachypnea noted  UC Treatments / Results  Labs (all labs ordered are listed, but only abnormal results are displayed) Labs Reviewed - No data to display  EKG None  Radiology No results found.  Procedures Procedures (including critical care time)  Medications Ordered in UC Medications  acetaminophen (TYLENOL) tablet 650 mg (650 mg Oral Given 01/06/19 0921)    Initial Impression / Assessment and Plan / UC Course  I have reviewed the triage  vital signs and the nursing notes.  Pertinent labs & imaging results that were available during my care of the patient were reviewed by me and considered in my medical decision making (see chart for details).     New onset fever, URI symptoms. Concern for Covid 19. Supportive cares recommended. Self isolation recommend and discussed with interpreter. Return precautions provided. Patient verbalized understanding and agreeable to plan. 'Ambulatory out of clinic without difficulty.   Final Clinical Impressions(s) / UC Diagnoses   Final diagnoses:  Upper respiratory tract infection, unspecified type     Discharge Instructions     I am worried about covid-19 infection. please self isolate for 7 days from onset of symptoms, or 3 days once no fever, whichever is longer. tylenol every 6 hours for fever. mucinex as needed for congestion. if any worsening of shortness of breath, chest pain or difficulty breathing please go to the ER.   Per google translate: Estoy preocupado por la infeccin cvidero-19. por favor, [employer] aparicin de los sntomas, o 3 das una vez sin fiebre, lo que sea ms [place]. tylenol cada 6 horas para la fiebre. mucinex segn sea necesario para la congestin. si hay algn empeoramiento de la dificultad para respirar, dolor en el pecho o dificultad para respirar, vaya a Urgencias.    ED Prescriptions    Medication Sig Dispense Auth. Provider   acetaminophen (TYLENOL) 500 MG tablet Take 1 tablet (500 mg total) by mouth every 6 (six) hours as needed. 30 tablet Linus Mako B, NP   guaiFENesin (MUCINEX) 600 MG 12 hr tablet Take 2 tablets (1,200 mg total) by mouth 2 (two) times daily as needed for up to 5 days. 20 tablet Georgetta Haber, NP     Controlled Substance Prescriptions [place] Controlled Substance Registry consulted? Not Applicable   Georgetta Haber, NP 01/06/19 1001

## 2019-01-06 NOTE — ED Triage Notes (Signed)
Started getting sick on Thursday.  Started Thursday with a cough.  On Saturday developed fever.    Reports a cough Chest hurts with deep inspiration Minimal sore throat.   Fever present

## 2019-01-06 NOTE — Discharge Instructions (Signed)
I am worried about covid-19 infection. please self isolate for 7 days from onset of symptoms, or 3 days once no fever, whichever is longer. tylenol every 6 hours for fever. mucinex as needed for congestion. if any worsening of shortness of breath, chest pain or difficulty breathing please go to the ER.   Per google translate: Estoy preocupado por la infeccin cvidero-19. por favor, [employer] aparicin de los sntomas, o 3 das una vez sin fiebre, lo que sea ms [place]. tylenol cada 6 horas para la fiebre. mucinex segn sea necesario para la congestin. si hay algn empeoramiento de la dificultad para respirar, dolor en el pecho o dificultad para respirar, vaya a Urgencias.

## 2019-07-23 ENCOUNTER — Other Ambulatory Visit: Payer: Self-pay

## 2019-07-23 DIAGNOSIS — Z20822 Contact with and (suspected) exposure to covid-19: Secondary | ICD-10-CM

## 2019-07-24 LAB — NOVEL CORONAVIRUS, NAA: SARS-CoV-2, NAA: NOT DETECTED

## 2022-10-19 ENCOUNTER — Other Ambulatory Visit: Payer: Self-pay | Admitting: Family Medicine

## 2022-10-19 DIAGNOSIS — R1031 Right lower quadrant pain: Secondary | ICD-10-CM

## 2022-10-31 ENCOUNTER — Other Ambulatory Visit: Payer: Self-pay

## 2022-11-06 ENCOUNTER — Ambulatory Visit
Admission: RE | Admit: 2022-11-06 | Discharge: 2022-11-06 | Disposition: A | Discharge status: Home / Self Care | Payer: Self-pay | Source: Ambulatory Visit | Attending: Family Medicine | Admitting: Family Medicine

## 2022-11-06 DIAGNOSIS — R1031 Right lower quadrant pain: Secondary | ICD-10-CM

## 2022-11-08 ENCOUNTER — Ambulatory Visit
Admission: RE | Admit: 2022-11-08 | Discharge: 2022-11-08 | Disposition: A | Discharge status: Home / Self Care | Payer: Self-pay | Source: Ambulatory Visit | Attending: Family Medicine | Admitting: Family Medicine

## 2022-11-08 DIAGNOSIS — R1031 Right lower quadrant pain: Secondary | ICD-10-CM

## 2022-11-10 ENCOUNTER — Other Ambulatory Visit: Payer: Self-pay | Admitting: Orthopedic Surgery

## 2022-11-10 DIAGNOSIS — R109 Unspecified abdominal pain: Secondary | ICD-10-CM

## 2022-12-11 ENCOUNTER — Ambulatory Visit
Admission: RE | Admit: 2022-12-11 | Discharge: 2022-12-11 | Disposition: A | Discharge status: Home / Self Care | Payer: Worker's Compensation | Source: Ambulatory Visit | Attending: Orthopedic Surgery | Admitting: Orthopedic Surgery

## 2022-12-11 DIAGNOSIS — R109 Unspecified abdominal pain: Secondary | ICD-10-CM

## 2023-06-05 ENCOUNTER — Other Ambulatory Visit: Payer: Self-pay | Admitting: Nurse Practitioner

## 2023-06-05 ENCOUNTER — Ambulatory Visit: Payer: Self-pay

## 2023-06-05 DIAGNOSIS — R52 Pain, unspecified: Secondary | ICD-10-CM

## 2024-03-14 ENCOUNTER — Other Ambulatory Visit: Payer: Self-pay | Admitting: Urology

## 2024-04-10 NOTE — Patient Instructions (Signed)
 DEBIDO AL COVID-19 SLO SE PERMITEN DOS VISITANTES (de 16 aos en adelante)  PARA QUE VENGAN CON USTED Y SE QUEDEN EN LA SALA DE ESPERA SOLAMENTE DURANTE EL PRE OP Y EL PROCEDIMIENTO.   **NO SE PERMITEN VISITAS EN EL REA DE CORTA ESTADA NI EN LA SALA DE RECUPERACIN!!**  SI VA A SER INGRESADO(A) AL HOSPITAL SLO SE LE PERMITEN CUATRO PERSONAS DE APOYO DURANTE LAS HORAS DE VISITA (7 AM -8PM)   La(s) persona(s) de apoyo debe(n) pasar nuestra evaluacin, entrar y salir con gel y usar la mscara en todo momento, incluso en la habitacin del paciente. Los pacientes tambin deben usar una mscara cuando el personal o su persona de apoyo estn en la habitacin. Los visitantes DEBEN LLEVAR ETIQUETA DE VISITANTE DE UNA MANERA VISIBLE. Un visitante adulto puede permanecer con usted durante la noche y DEBE estar en la habitacin a las 8 P.M.     Su procedimiento est programado en: 04/18/24   Presntese a la entrada principal del [facility]     Presntese a admisiones por la maana a las 10:45 AM   Llame a este nmero si tiene problemas la maana de la ciruga al [phone]   No consuma alimentos: Despus de la medianoche   Despus de la medianoche puede tomar los siguientes lquidos hasta la(s) : 10:00 AM DEL DA DE LA CIRUGA  Agua Caf negro (con azcar, SIN LECHE, NI CREMA)  T normal y descafeinado (con azcar, SIN LECHE, NI CREMA)                              Gelatina normal (NO ROJA)                                           Helados de frutas (sin pulpa. NO DE COLOR ROJO)                                     Helados de hielo (NO ROJO)                                                                  Jugo: de manzana, uva BLANCA, arndano BLANCO Bebidas deportivas como Gatorade (NO ROJAS)  SIGA CUALQUIER INSTRUCCIN ADICIONAL PREOPERATORIA QUE HAYA RECIBIDO DEL OFICINA DE DU CIRUJANO!!!   La higiene bucal tambin es importante para reducir el riesgo de infeccin.                                    Recuerde - LVESE LOS DIENTES EN LA MAANA DE LA CIRUGA CON SU PASTA DENTAL HABITUAL   NO fume despus de la medianoche   [employer] en la maana de la ciruga con UN SORBO DE AGUABETHA ICE.                               No debe trae ningn metal en  el cuerpo, incluyendo pinzas para el cabello, joyas, ni aretes/pendientes             No use lociones/cremas, polvos, perfumes/colonias o desodorante              Los hombres pueden afeitarse la cara y el cuello.   No traiga objetos de valor al hospital. [place] NO SE HACE RESPONSABLE DE LOS OBJETOS DE VALOR.   Los contactos, las dentaduras o los puentes no se pueden usar durante la [place].   Melonie una bolsa pequea para la noche el da de la [place].   NO TRAIGA AL HOSPITAL LOS MEDICAMENTOS QUE TOMA EN CASA . LA FARMACIA LE SUMINISTRAR LOS MEDICAMENTOS QUE TENGA EN SU LISTA DE MEDICAMENTOS DURANTE SU ESTADA EN EL HOSPITAL!    Los pacientes dados de alta el mismo da de la ciruga no podrn Company secretary a casa.  Es NECESARIO que alguien se quede con usted durante las primeras 24 horas despus de la anestesia.   Instrucciones especiales: Melonie query copia de sus documentos de poder notarial y testamento vital el da de su ciruga si no los ha escaneado antes.              Por favor, lea las siguientes hojas informativas que le dieron: SI TIENE PREGUNTAS SOBRE SUS INSTRUCCIONES PREOPERATORIAS POR FAVOR LLAME AL [phone]                        PREPARACIN PARA LA CIRUGA                                            Preparing for Surgery  Debido a que la piel no est esterilizada, sta necesita estar lo ms libre de grmenes como sea posible.  Usted puede reducir el nmero de grmenes en la piel lavndose con el jabn de CHG (Chlorahexidine gluconate) antes de la ciruga.  El CHG es un jabn antisptico el cual mata los grmenes y se une a la piel para continuar matando los grmenes incluso hasta despus de  lavarse. POR FAVOR NO LO USE SI USTED TIENE ALERGIAS AL CHG.  SI LA PIEL SE IRRITA, DEJE DE USAR EL CHG.  NO SE RASURE DURANTE AL MENOS 12 HORAS ANTES DE LA PRIMERA DUCHA CON EL CHG. Siga estas instrucciones cuidadosamente:  Dchese la noche anterior a la [place] y de nuevo en la maana de la [place]. Si decide lavarse el cabello, lvelo con su champ normal primero. Enjuague el cabello y el cuerpo para quitarse el [place]. Use el CHG como lo hara con cualquier otro jabn lquido, usando una toallita o esponja vegetal o exfoliante. Aplique el CHG al cuerpo solamente DEL CUELLO PARA ABAJO.  No lo use cerca de los ojos o los genitales. No se lave con su jabn normal despus de usar el CHG. Squese con una toalla limpia. Espere hasta la maana siguiente para aplicarse desodorantes, lociones, excepto en el da de la [place], NO SE APLIQUE LOCIONES. Use pijamas limpias o una bata. Coloque sbanas limpias en su cama la noche de su primera ducha - no duerma con mascotas. 10.  Use ropa limpia al venir al hospital.

## 2024-04-11 ENCOUNTER — Encounter (HOSPITAL_COMMUNITY): Payer: Self-pay

## 2024-04-11 ENCOUNTER — Other Ambulatory Visit: Payer: Self-pay

## 2024-04-11 ENCOUNTER — Encounter (HOSPITAL_COMMUNITY)
Admission: RE | Admit: 2024-04-11 | Discharge: 2024-04-11 | Disposition: A | Discharge status: Home / Self Care | Payer: Self-pay | Source: Ambulatory Visit | Attending: Urology | Admitting: Urology

## 2024-04-11 VITALS — BP 142/92 | HR 94 | Temp 98.9°F | Ht 67.0 in | Wt 223.0 lb

## 2024-04-11 DIAGNOSIS — Z01818 Encounter for other preprocedural examination: Secondary | ICD-10-CM

## 2024-04-11 DIAGNOSIS — Z01812 Encounter for preprocedural laboratory examination: Secondary | ICD-10-CM | POA: Insufficient documentation

## 2024-04-11 LAB — CBC
HCT: 47.8 % (ref 39.0–52.0)
Hemoglobin: 16 g/dL (ref 13.0–17.0)
MCH: 30.5 pg (ref 26.0–34.0)
MCHC: 33.5 g/dL (ref 30.0–36.0)
MCV: 91 fL (ref 80.0–100.0)
Platelets: 228 K/uL (ref 150–400)
RBC: 5.25 MIL/uL (ref 4.22–5.81)
RDW: 12.8 % (ref 11.5–15.5)
WBC: 8.8 K/uL (ref 4.0–10.5)
nRBC: 0 % (ref 0.0–0.2)

## 2024-04-11 NOTE — Progress Notes (Addendum)
 For Anesthesia: PCP - Kristy Sharolyn Lenis, PA-C  Cardiologist - N/A  Bowel Prep reminder:N/A  Chest x-ray -  EKG -  Stress Test -  ECHO -  Cardiac Cath -  Pacemaker/ICD device last checked: Pacemaker orders received: Device Rep notified:  Spinal Cord Stimulator:N/A  Sleep Study - N/A CPAP -   Fasting Blood Sugar - N/A Checks Blood Sugar _____ times a day Date and result of last Hgb A1c-  Last dose of GLP1 agonist- N/A GLP1 instructions:   Last dose of SGLT-2 inhibitors- N/A SGLT-2 instructions:   Blood Thinner Instructions:N/A Aspirin Instructions: Last Dose:  Activity level: Can go up a flight of stairs and activities of daily living without stopping and without chest pain and/or shortness of breath   Able to exercise without chest pain and/or shortness of breath  Anesthesia review:   Patient denies shortness of breath, fever, cough and chest pain at PAT appointment   Patient verbalized understanding of instructions that were reviewed over the telephone.

## 2024-04-18 ENCOUNTER — Other Ambulatory Visit: Payer: Self-pay

## 2024-04-18 ENCOUNTER — Encounter (HOSPITAL_COMMUNITY): Payer: Self-pay | Admitting: Urology

## 2024-04-18 ENCOUNTER — Encounter (HOSPITAL_COMMUNITY)
Admission: RE | Disposition: A | Discharge status: Home / Self Care | Payer: Self-pay | Source: Ambulatory Visit | Attending: Urology

## 2024-04-18 ENCOUNTER — Ambulatory Visit (HOSPITAL_COMMUNITY)
Admission: RE | Admit: 2024-04-18 | Discharge: 2024-04-18 | Disposition: A | Discharge status: Home / Self Care | Payer: Self-pay | Source: Ambulatory Visit | Attending: Urology | Admitting: Urology

## 2024-04-18 ENCOUNTER — Ambulatory Visit (HOSPITAL_COMMUNITY): Payer: Self-pay | Admitting: Medical

## 2024-04-18 ENCOUNTER — Ambulatory Visit (HOSPITAL_BASED_OUTPATIENT_CLINIC_OR_DEPARTMENT_OTHER): Payer: Self-pay | Admitting: Anesthesiology

## 2024-04-18 DIAGNOSIS — L723 Sebaceous cyst: Secondary | ICD-10-CM

## 2024-04-18 DIAGNOSIS — F17210 Nicotine dependence, cigarettes, uncomplicated: Secondary | ICD-10-CM | POA: Insufficient documentation

## 2024-04-18 HISTORY — PX: SCROTECTOMY: SHX6207

## 2024-04-18 HISTORY — DX: Other specified health status: Z78.9

## 2024-04-18 SURGERY — EXCISION, SCROTUM
Anesthesia: General

## 2024-04-18 MED ORDER — OXYCODONE-ACETAMINOPHEN 5-325 MG PO TABS: 1.0000 | ORAL_TABLET | ORAL | 0 refills | Status: AC | PRN

## 2024-04-18 MED ORDER — DEXAMETHASONE SODIUM PHOSPHATE 10 MG/ML IJ SOLN
INTRAMUSCULAR | Status: DC | PRN
Start: 2024-04-18 — End: 2024-04-18
  Administered 2024-04-18: 5 mg via INTRAVENOUS

## 2024-04-18 MED ORDER — 0.9 % SODIUM CHLORIDE (POUR BTL) OPTIME
TOPICAL | Status: DC | PRN
  Administered 2024-04-18: 1000 mL

## 2024-04-18 MED ORDER — DEXMEDETOMIDINE HCL IN NACL 80 MCG/20ML IV SOLN
INTRAVENOUS | Status: DC | PRN
  Administered 2024-04-18 (×2): 4 ug via INTRAVENOUS

## 2024-04-18 MED ORDER — LIDOCAINE HCL (PF) 1 % IJ SOLN
INTRAMUSCULAR | Status: AC
  Filled 2024-04-18: qty 30

## 2024-04-18 MED ORDER — MIDAZOLAM HCL 2 MG/2ML IJ SOLN
INTRAMUSCULAR | Status: AC
Start: 2024-04-18 — End: 2024-04-18
  Filled 2024-04-18: qty 2

## 2024-04-18 MED ORDER — FENTANYL CITRATE (PF) 100 MCG/2ML IJ SOLN
INTRAMUSCULAR | Status: DC | PRN
  Administered 2024-04-18 (×2): 50 ug via INTRAVENOUS

## 2024-04-18 MED ORDER — ORAL CARE MOUTH RINSE: 15.0000 mL | Freq: Once | OROMUCOSAL | Status: AC

## 2024-04-18 MED ORDER — LACTATED RINGERS IV SOLN: INTRAVENOUS | Status: DC

## 2024-04-18 MED ORDER — PROPOFOL 10 MG/ML IV BOLUS
INTRAVENOUS | Status: AC
Start: 2024-04-18 — End: 2024-04-18
  Filled 2024-04-18: qty 20

## 2024-04-18 MED ORDER — MIDAZOLAM HCL 5 MG/5ML IJ SOLN
INTRAMUSCULAR | Status: DC | PRN
  Administered 2024-04-18: 2 mg via INTRAVENOUS

## 2024-04-18 MED ORDER — LIDOCAINE HCL (CARDIAC) PF 100 MG/5ML IV SOSY
PREFILLED_SYRINGE | INTRAVENOUS | Status: DC | PRN
Start: 2024-04-18 — End: 2024-04-18
  Administered 2024-04-18: 100 mg via INTRAVENOUS

## 2024-04-18 MED ORDER — DEXAMETHASONE SODIUM PHOSPHATE 10 MG/ML IJ SOLN
INTRAMUSCULAR | Status: AC
  Filled 2024-04-18: qty 1

## 2024-04-18 MED ORDER — ONDANSETRON HCL 4 MG/2ML IJ SOLN
INTRAMUSCULAR | Status: AC
Start: 2024-04-18 — End: 2024-04-18
  Filled 2024-04-18: qty 2

## 2024-04-18 MED ORDER — CEFAZOLIN SODIUM-DEXTROSE 2-4 GM/100ML-% IV SOLN
2.0000 g | INTRAVENOUS | Status: AC
  Administered 2024-04-18: 2 g via INTRAVENOUS
  Filled 2024-04-18: qty 100

## 2024-04-18 MED ORDER — LIDOCAINE HCL (PF) 1 % IJ SOLN
INTRAMUSCULAR | Status: DC | PRN
  Administered 2024-04-18: 10 mL

## 2024-04-18 MED ORDER — FENTANYL CITRATE (PF) 100 MCG/2ML IJ SOLN
INTRAMUSCULAR | Status: AC
Start: 2024-04-18 — End: 2024-04-18
  Filled 2024-04-18: qty 2

## 2024-04-18 MED ORDER — LIDOCAINE HCL (PF) 2 % IJ SOLN
INTRAMUSCULAR | Status: AC
  Filled 2024-04-18: qty 5

## 2024-04-18 MED ORDER — PROPOFOL 10 MG/ML IV BOLUS
INTRAVENOUS | Status: DC | PRN
  Administered 2024-04-18: 200 mg via INTRAVENOUS

## 2024-04-18 MED ORDER — ONDANSETRON HCL 4 MG/2ML IJ SOLN
INTRAMUSCULAR | Status: DC | PRN
  Administered 2024-04-18: 4 mg via INTRAVENOUS

## 2024-04-18 MED ORDER — GLYCOPYRROLATE 0.2 MG/ML IJ SOLN
INTRAMUSCULAR | Status: DC | PRN
  Administered 2024-04-18: .1 mg via INTRAVENOUS

## 2024-04-18 MED ORDER — SUGAMMADEX SODIUM 200 MG/2ML IV SOLN
INTRAVENOUS | Status: AC
  Filled 2024-04-18: qty 2

## 2024-04-18 MED ORDER — DEXMEDETOMIDINE HCL IN NACL 80 MCG/20ML IV SOLN
INTRAVENOUS | Status: AC
  Filled 2024-04-18: qty 20

## 2024-04-18 MED ORDER — CHLORHEXIDINE GLUCONATE 0.12 % MT SOLN
15.0000 mL | Freq: Once | OROMUCOSAL | Status: AC
  Administered 2024-04-18: 15 mL via OROMUCOSAL

## 2024-04-18 SURGICAL SUPPLY — 30 items
BLADE SURG 15 STRL LF DISP TIS (BLADE) ×1 IMPLANT
BNDG COHESIVE 2X5 TAN ST LF (GAUZE/BANDAGES/DRESSINGS) ×1 IMPLANT
COVER SURGICAL LIGHT HANDLE (MISCELLANEOUS) ×1 IMPLANT
DERMABOND ADVANCED .7 DNX6 (GAUZE/BANDAGES/DRESSINGS) ×1 IMPLANT
DRAPE LAPAROTOMY T 98X78 PEDS (DRAPES) ×1 IMPLANT
DRSG XEROFORM 1X8 (GAUZE/BANDAGES/DRESSINGS) ×1 IMPLANT
ELECT NDL TIP 2.8 STRL (NEEDLE) IMPLANT
ELECT NEEDLE TIP 2.8 STRL (NEEDLE) IMPLANT
ELECT REM PT RETURN 15FT ADLT (MISCELLANEOUS) ×1 IMPLANT
GAUZE 4X4 16PLY ~~LOC~~+RFID DBL (SPONGE) ×1 IMPLANT
GAUZE SPONGE 4X4 12PLY STRL (GAUZE/BANDAGES/DRESSINGS) IMPLANT
GAUZE STRETCH 2X75IN STRL (MISCELLANEOUS) ×1 IMPLANT
GLOVE BIOGEL M 7.0 STRL (GLOVE) ×1 IMPLANT
GLOVE BIOGEL PI IND STRL 7.5 (GLOVE) ×1 IMPLANT
GOWN STRL REUS W/ TWL XL LVL3 (GOWN DISPOSABLE) ×1 IMPLANT
KIT BASIN OR (CUSTOM PROCEDURE TRAY) ×1 IMPLANT
KIT TURNOVER KIT A (KITS) ×1 IMPLANT
NDL HYPO 25X1 1.5 SAFETY (NEEDLE) ×1 IMPLANT
NEEDLE HYPO 25X1 1.5 SAFETY (NEEDLE) ×1 IMPLANT
NS IRRIG 1000ML POUR BTL (IV SOLUTION) IMPLANT
PACK BASIC VI WITH GOWN DISP (CUSTOM PROCEDURE TRAY) ×1 IMPLANT
PENCIL SMOKE EVACUATOR (MISCELLANEOUS) ×1 IMPLANT
SPIKE FLUID TRANSFER (MISCELLANEOUS) IMPLANT
SUT MNCRL AB 4-0 PS2 18 (SUTURE) IMPLANT
SUT SILK 2 0 SH (SUTURE) ×1 IMPLANT
SUT VIC AB 4-0 RB1 27XBRD (SUTURE) ×2 IMPLANT
SYR 10ML LL (SYRINGE) ×1 IMPLANT
SYR BULB IRRIG 60ML STRL (SYRINGE) IMPLANT
SYR CONTROL 10ML LL (SYRINGE) ×1 IMPLANT
TOWEL OR 17X26 10 PK STRL BLUE (TOWEL DISPOSABLE) ×1 IMPLANT

## 2024-04-18 NOTE — Anesthesia Postprocedure Evaluation (Signed)
 Anesthesia Post Note  Patient: Edward Gardner  Procedure(s) Performed: EXCISION, SCROTUM     Patient location during evaluation: PACU Anesthesia Type: General Level of consciousness: awake and alert Pain management: pain level controlled Vital Signs Assessment: post-procedure vital signs reviewed and stable Respiratory status: spontaneous breathing, nonlabored ventilation, respiratory function stable and patient connected to nasal cannula oxygen Cardiovascular status: blood pressure returned to baseline and stable Postop Assessment: no apparent nausea or vomiting Anesthetic complications: no   No notable events documented.  Last Vitals:  Vitals:   04/18/24 1523 04/18/24 1530  BP: (!) 134/96 125/87  Pulse: 66 63  Resp: 19   Temp:    SpO2: 97% 97%    Last Pain:  Vitals:   04/18/24 1141  TempSrc: Oral  PainSc: 0-No pain                 Rome Ade

## 2024-04-18 NOTE — Transfer of Care (Signed)
 Immediate Anesthesia Transfer of Care Note  Patient: Edward Gardner  Procedure(s) Performed: EXCISION, SCROTUM  Patient Location: PACU  Anesthesia Type:General  Level of Consciousness: awake, alert , oriented, and patient cooperative  Airway & Oxygen Therapy: Patient Spontanous Breathing and Patient connected to face mask oxygen  Post-op Assessment: Report given to RN and Post -op Vital signs reviewed and stable  Post vital signs: Reviewed and stable  Last Vitals:  Vitals Value Taken Time  BP 132/87 04/18/24 14:54  Temp    Pulse 73 04/18/24 14:56  Resp 20 04/18/24 14:56  SpO2 97 % 04/18/24 14:56  Vitals shown include unfiled device data.  Last Pain:  Vitals:   04/18/24 1141  TempSrc: Oral  PainSc: 0-No pain      Patients Stated Pain Goal: 5 (04/18/24 1141)  Complications: No notable events documented.

## 2024-04-18 NOTE — Discharge Instructions (Signed)
   Activity:  You are encouraged to ambulate frequently (about every hour during waking hours) to help prevent blood clots from forming in your legs or lungs.  However, you should not engage in any heavy lifting (> 10-15 lbs), strenuous activity, or straining.   Diet: You should advance your diet as instructed by your physician.  It will be normal to have some bloating, nausea, and abdominal discomfort intermittently.   Prescriptions:  You will be provided a prescription for pain medication to take as needed.  If your pain is not severe enough to require the prescription pain medication, you may take extra strength Tylenol instead which will have less side effects.  You should also take a prescribed stool softener to avoid straining with bowel movements as the prescription pain medication may constipate you.   Incisions: You may remove your dressing bandages 48 hours after surgery if not removed in the hospital.  You will either have some small staples or special tissue glue at each of the incision sites. Once the bandages are removed (if present), the incisions may stay open to air.  You may start showering (but not soaking or bathing in water) the 2nd day after surgery and the incisions simply need to be patted dry after the shower.  No additional care is needed.   What to call us about: You should call the office [phone]) if you develop fever > 101 or develop persistent vomiting. Activity:  You are encouraged to ambulate frequently (about every hour during waking hours) to help prevent blood clots from forming in your legs or lungs.  However, you should not engage in any heavy lifting (> 10-15 lbs), strenuous activity, or straining.

## 2024-04-18 NOTE — Anesthesia Preprocedure Evaluation (Signed)
 Anesthesia Evaluation  Patient identified by MRN, date of birth, ID band Patient awake    Reviewed: Allergy & Precautions, NPO status , Patient's Chart, lab work & pertinent test results  History of Anesthesia Complications Negative for: history of anesthetic complications  Airway Mallampati: IV  TM Distance: >3 FB Neck ROM: Full    Dental no notable dental hx. (+) Teeth Intact   Pulmonary neg sleep apnea, neg COPD, Current Smoker and Patient abstained from smoking.   Pulmonary exam normal breath sounds clear to auscultation       Cardiovascular Exercise Tolerance: Good METS(-) hypertension(-) CAD and (-) Past MI negative cardio ROS (-) dysrhythmias  Rhythm:Regular Rate:Normal - Systolic murmurs    Neuro/Psych negative neurological ROS  negative psych ROS   GI/Hepatic ,neg GERD  ,,(+)     (-) substance abuse    Endo/Other  neg diabetes    Renal/GU negative Renal ROS     Musculoskeletal   Abdominal   Peds  Hematology   Anesthesia Other Findings Past Medical History: No date: Medical history non-contributory  Reproductive/Obstetrics                              Anesthesia Physical Anesthesia Plan  ASA: 2  Anesthesia Plan: General   Post-op Pain Management: Toradol  IV (intra-op)* and Ofirmev  IV (intra-op)*   Induction: Intravenous  PONV Risk Score and Plan: 2 and Ondansetron, Dexamethasone and Midazolam  Airway Management Planned: LMA  Additional Equipment: None  Intra-op Plan:   Post-operative Plan: Extubation in OR  Informed Consent: I have reviewed the patients History and Physical, chart, labs and discussed the procedure including the risks, benefits and alternatives for the proposed anesthesia with the patient or authorized representative who has indicated his/her understanding and acceptance.     Dental advisory given  Plan Discussed with: CRNA and  Surgeon  Anesthesia Plan Comments: (Discussed risks of anesthesia with patient, including PONV, sore throat, lip/dental/eye damage. Rare risks discussed as well, such as cardiorespiratory and neurological sequelae, and allergic reactions. Discussed the role of CRNA in patient's perioperative care. Patient understands. Patient counseled on benefits of smoking cessation, and increased perioperative risks associated with continued smoking. )        Anesthesia Quick Evaluation

## 2024-04-18 NOTE — H&P (Signed)
 Office Visit Report     04/11/2024   --------------------------------------------------------------------------------   Edward Gardner  MRN: [id number]  DOB: 04/09/1986, 38 year old Male  SSN:    PRIMARY CARE:     REFERRING:    PROVIDER:  Donnice Siad, M.D.  TREATING:  Ubaldo Eagles, NP  LOCATION:  [facility] [phone]     --------------------------------------------------------------------------------   CC/HPI: Edward Gardner is a 38 year old male who is seen in consultation today for scrotal sebaceous cyst.   1. Scrotal sebaceous cyst: Patient has noticed a cyst on the anterior aspect of the scrotum for many months. He denies any discomfort or drainage. He has occasional itchiness. He is interested in surgical removal. He denies prior history of sexually-transmitted infections. He denies voiding complaints.   Patient currently denies fever, chills, sweats, nausea, vomiting, abdominal or flank pain, gross hematuria or dysuria.   He denies prior past medical history or past medical history. He has no known drug allergies. He denies taking medications.   04/11/2024: Here today for preoperative exam prior to undergoing surgical excision of scrotal sebaceous cyst with Dr. Siad on 7/25. He denies any changes in past medical history, prescription medications taken on a daily basis, no interval surgical or procedural intervention. Denies any new or worsening LUTS including absence of any dysuria or gross hematuria. Denies any interval chest pain, shortness of breath, fever/chills or nausea/vomiting.     ALLERGIES: None   MEDICATIONS: None   GU PSH: No GU PSH    NON-GU PSH: Visit Complexity (formerly GPC1X) - 02/28/2024     GU PMH: No GU PMH    NON-GU PMH: Sebaceous cyst - 02/28/2024    FAMILY HISTORY: No Family History    SOCIAL HISTORY: Marital Status: Unknown Current Smoking Status: Patient smokes occasionally.   Tobacco Use Assessment Completed: Used Tobacco in  last 30 days? Drinks 8 drinks per week. Light Drinker.     REVIEW OF SYSTEMS:    GU Review Male:   Patient denies frequent urination, hard to postpone urination, burning/ pain with urination, get up at night to urinate, leakage of urine, stream starts and stops, trouble starting your stream, have to strain to urinate , erection problems, and penile pain.  Gastrointestinal (Upper):   Patient denies nausea, vomiting, and indigestion/ heartburn.  Gastrointestinal (Lower):   Patient denies diarrhea and constipation.  Constitutional:   Patient denies fever, night sweats, weight loss, and fatigue.  Skin:   Patient denies skin rash/ lesion and itching.  Eyes:   Patient denies blurred vision and double vision.  Ears/ Nose/ Throat:   Patient denies sore throat and sinus problems.  Hematologic/Lymphatic:   Patient denies swollen glands and easy bruising.  Cardiovascular:   Patient denies leg swelling and chest pains.  Respiratory:   Patient denies cough and shortness of breath.  Endocrine:   Patient denies excessive thirst.  Musculoskeletal:   Patient denies back pain and joint pain.  Neurological:   Patient denies headaches and dizziness.  Psychologic:   Patient denies depression and anxiety.   Notes: Certified Spanish language medical interpreter via audio call utilized for today's appointment.    VITAL SIGNS:      04/11/2024 02:09 PM  Weight 172 lb / 78.02 kg  Height 67 in / 170.18 cm  BP 130/84 mmHg  Pulse 101 /min  Temperature 98.4 F / 36.8 C  BMI 26.9 kg/m   MULTI-SYSTEM PHYSICAL EXAMINATION:    Constitutional: Well-nourished. No physical deformities. Normally developed. Good  grooming.  Neck: Neck symmetrical, not swollen. Normal tracheal position.  Respiratory: No labored breathing, no use of accessory muscles.   Cardiovascular: Normal temperature, normal extremity pulses, no swelling, no varicosities.  Neurologic / Psychiatric: Oriented to time, oriented to place, oriented to  person. No depression, no anxiety, no agitation.  Gastrointestinal: No mass, no tenderness, no rigidity, non obese abdomen.  Musculoskeletal: Normal gait and station of head and neck.     Complexity of Data:  Source Of History:  Patient, Medical Record Summary  Records Review:   Previous Doctor Records, Previous Hospital Records, Previous Patient Records  Urine Test Review:   Urinalysis   PROCEDURES: None   ASSESSMENT:      ICD-10 Details  1 NON-GU:   Sebaceous cyst - L72.3 Chronic, Stable  2   Encounter for other preprocedural examination - Z01.818 Chronic, Stable   PLAN:           Orders Labs Urine Culture          Document Letter(s):  Created for Patient: Clinical Summary         Notes:   All questions answered to the best of my ability regarding the upcoming procedure expected postoperative course with understanding expressed by the patient. He will proceed with previously scheduled surgical incision of scrotal sebaceous cyst with Dr. Selma on 7/25.        Next Appointment:      Next Appointment: 04/18/2024 01:00 PM    Appointment Type: Surgery     Location: [facility] [phone]    Provider: Donnice Selma, M.D.    Reason for Visit: [facility]/OP EXCISION OF SCROTAL SEBACEOUS CYST $300.00 DUE 07/18    Urology Preoperative H&P   Chief Complaint: Scrotal sebaceous cyst   History of Present Illness: Edward Gardner is a 38 y.o. male with a scrotal sebaceous cyst here for excision. Denies fevers, chills, dysuria.    No past medical history on file.  Past Surgical History:  Procedure Laterality Date   WISDOM TOOTH EXTRACTION      Allergies: No Known Allergies  Family History  Problem Relation Age of Onset   Hypertension Mother    Hypertension Brother     Social History:  reports that he has been smoking cigarettes. He does not have any smokeless tobacco history on file. He reports current alcohol use. He reports that he does not use  drugs.  ROS: A complete review of systems was performed.  All systems are negative except for pertinent findings as noted.  Physical Exam:  Vital signs in last 24 hours:   Constitutional:  Alert and oriented, No acute distress Cardiovascular: Regular rate and rhythm Respiratory: Normal respiratory effort, Lungs clear bilaterally GI: Abdomen is soft, nontender, nondistended, no abdominal masses GU: No CVA tenderness Lymphatic: No lymphadenopathy Neurologic: Grossly intact, no focal deficits Psychiatric: Normal mood and affect  Laboratory Data:  No results for input(s): WBC, HGB, HCT, PLT in the last 72 hours.  No results for input(s): NA, K, CL, GLUCOSE, BUN, CALCIUM, CREATININE in the last 72 hours.  Invalid input(s): CO3   No results found for this or any previous visit (from the past 24 hours). No results found for this or any previous visit (from the past 240 hours).  Renal Function: No results for input(s): CREATININE in the last 168 hours. CrCl cannot be calculated (Patient's most recent lab result is older than the maximum 21 days allowed.).  Radiologic Imaging: No results found.  I  independently reviewed the above imaging studies.  Assessment and Plan Edward Gardner is a 38 y.o. male with a scrotal sebaceous cyst here for excision.   Edward R. Louanna Vanliew MD 04/18/2024, 7:41 AM  [facility] Pager: [phone]): [phone]

## 2024-04-18 NOTE — Op Note (Addendum)
 Operative Note  Preoperative diagnosis:  1.  Scrotal sebaceous cysts  Postoperative diagnosis: 1.  Same  Procedure(s): 1.  Excision of scrotal sebaceous cysts  Surgeon: Donnice Siad, MD  Assistants:  None  Anesthesia:  General  Complications:  None  EBL:  Minimal  Specimens: 1. None  Drains/Catheters: 1.  None  Intraoperative findings:   3 separate scrotal sebaceous cysts total measuring 3x2cm successfully excised excellent hemostasis  Indication:  Edward Gardner is a 38 y.o. male with several scrotal sebaceous cysts here for excision.   Description of procedure: The indications, alternatives, benefits and risks were discussed with the patient and informed sent was obtained.  The patient was brought onto the operating room table, positioned supine, and secured with a safety strap.  All pressure points were carefully padded and pneumatic compression devices were placed on the lower extremities.  After the administration of intravenous antibiotics and general anesthesia, the patient scrotum was examined and the 3 scrotal sebaceous cysts were palpated. This area was about 3x2cm. The lower abdomen and external genitalia were prepped and draped in the standard sterile manner.  A time out was completed, verifying the correct patient, surgical procedure, site and positioning, prior to beginning the procedure.  A skin incision was made over the large sebaceous cyst and this was excised. This was performed with all cysts. Again, this measured about 3x2cm. Next, hemostasis was obtained with cautery. The skin was then closed with a 4-0 monocryl suture.   Dermabond was applied on the incision. At the end the procedure, all counts were correct.  The patient tolerated procedure well was taken the recovery room stable condition.  Plan: The patient be discharged home and he will follow-up in the office in 7-10 days.  Matt R. Noel Henandez MD [facility]  Pager: [phone]

## 2024-04-18 NOTE — Anesthesia Procedure Notes (Signed)
 Procedure Name: LMA Insertion Date/Time: 04/18/2024 2:19 PM  Performed by: Erick Fitz, CRNAPre-anesthesia Checklist: Patient identified, Emergency Drugs available, Suction available, Patient being monitored and Timeout performed Patient Re-evaluated:Patient Re-evaluated prior to induction Oxygen Delivery Method: Circle system utilized Preoxygenation: Pre-oxygenation with 100% oxygen Induction Type: IV induction Ventilation: Mask ventilation without difficulty LMA: LMA inserted LMA Size: 4.0 Number of attempts: 1 Placement Confirmation: positive ETCO2 and CO2 detector Tube secured with: Tape (secured with pink Hy-tape) Dental Injury: Teeth and Oropharynx as per pre-operative assessment

## 2024-04-19 ENCOUNTER — Encounter (HOSPITAL_COMMUNITY): Payer: Self-pay | Admitting: Urology
